# Patient Record
Sex: Female | Born: 1985 | Marital: Married | Smoking: Never smoker
Health system: Southern US, Community
[De-identification: ages and names within clinical notes are randomized; demographics above are authoritative.]

## PROBLEM LIST (undated history)

## (undated) DIAGNOSIS — Z789 Other specified health status: Secondary | ICD-10-CM

## (undated) HISTORY — PX: NO PAST SURGERIES: SHX2092

---

## 2010-12-17 ENCOUNTER — Other Ambulatory Visit: Payer: Self-pay | Admitting: Infectious Diseases

## 2010-12-17 ENCOUNTER — Ambulatory Visit
Admission: RE | Admit: 2010-12-17 | Discharge: 2010-12-17 | Disposition: A | Discharge status: Home / Self Care | Payer: PRIVATE HEALTH INSURANCE | Source: Ambulatory Visit | Attending: Infectious Diseases | Admitting: Infectious Diseases

## 2010-12-17 DIAGNOSIS — R9389 Abnormal findings on diagnostic imaging of other specified body structures: Secondary | ICD-10-CM

## 2010-12-17 DIAGNOSIS — R7611 Nonspecific reaction to tuberculin skin test without active tuberculosis: Secondary | ICD-10-CM

## 2012-12-13 IMAGING — CR DG CHEST 2V
2 series · 2 of 2 positions shown · non-contrast
Comparison: Chest x-ray from [facility] dated 07/19/2010

CLINICAL DATA: Positive PPD

CHEST - 2 VIEW

[view not recorded (1 of 2)]
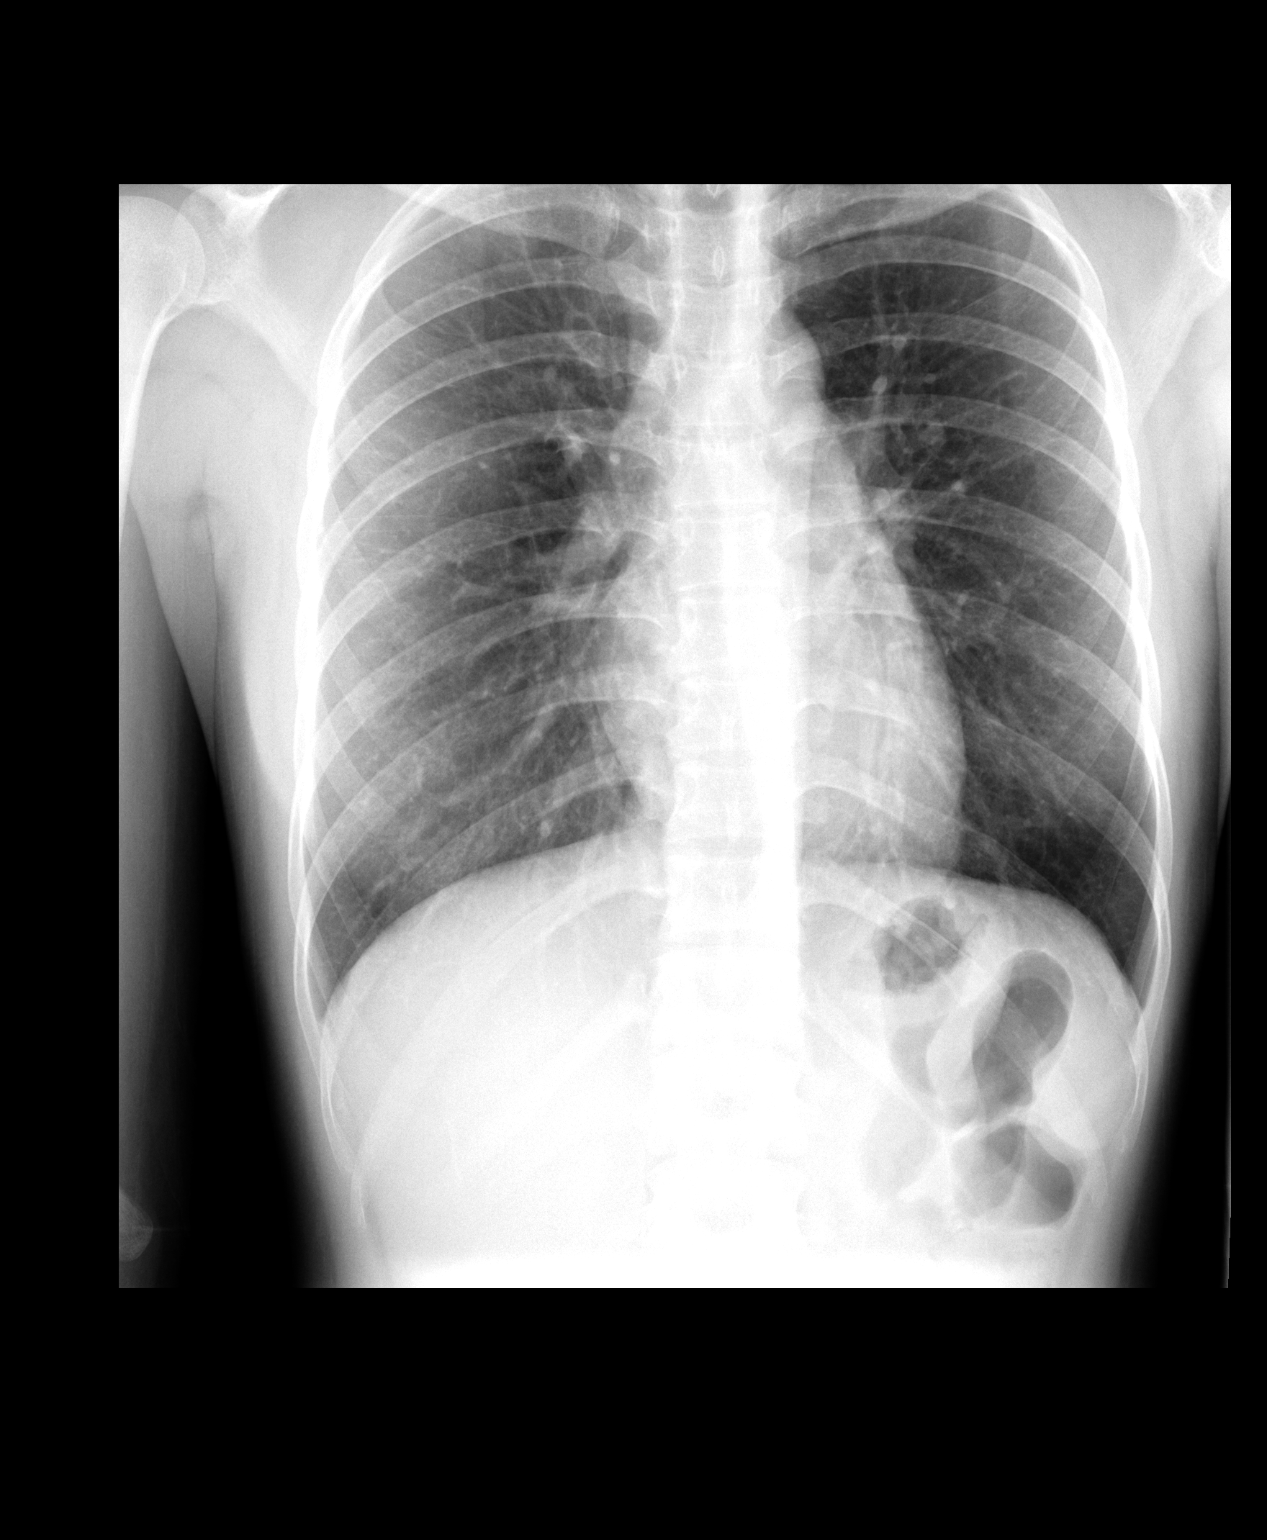

[view not recorded (2 of 2)]
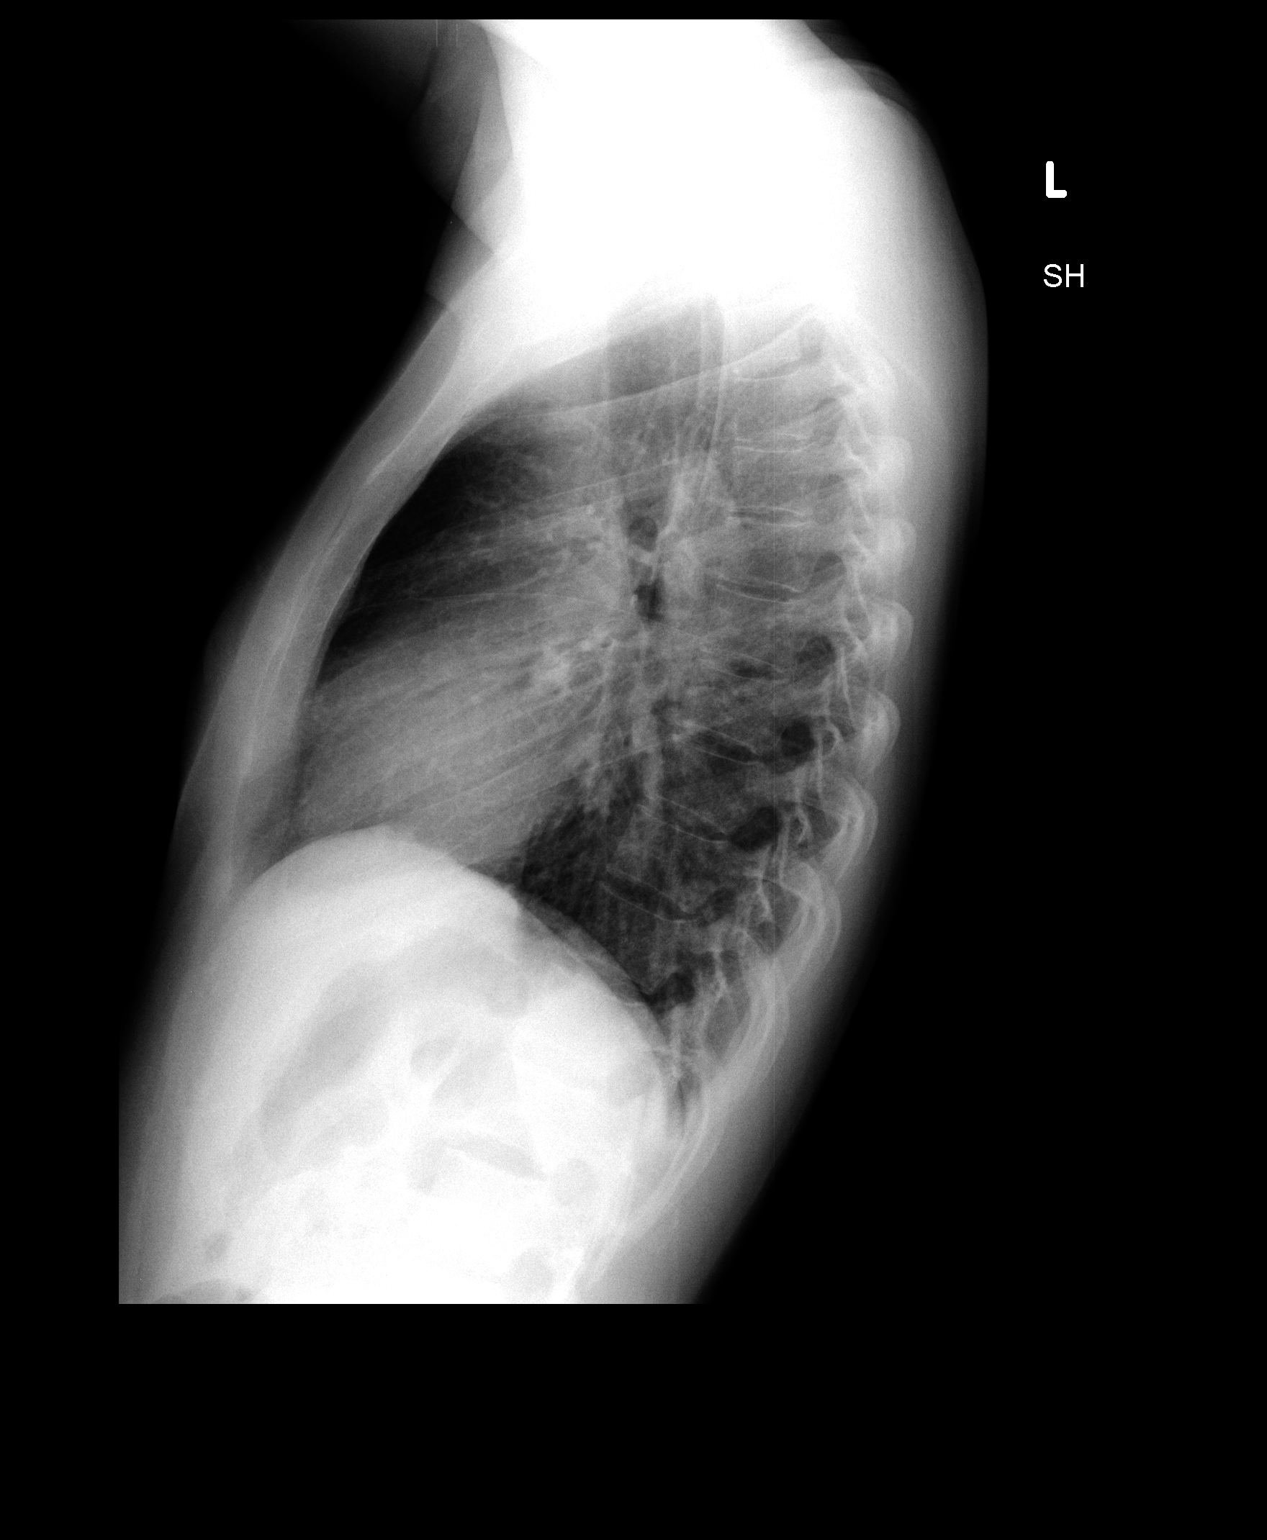

[2 of 2 positions shown; findings below may reference images not displayed]

FINDINGS: No active infiltrate or effusion is seen.  Mediastinal
contours appear normal.  The heart is within normal limits in size.
No bony abnormality is seen.
IMPRESSION: No active lung disease.

## 2018-10-16 DIAGNOSIS — Z3402 Encounter for supervision of normal first pregnancy, second trimester: Secondary | ICD-10-CM | POA: Diagnosis not present

## 2018-10-22 DIAGNOSIS — Z3009 Encounter for other general counseling and advice on contraception: Secondary | ICD-10-CM | POA: Diagnosis not present

## 2018-10-22 DIAGNOSIS — Z3402 Encounter for supervision of normal first pregnancy, second trimester: Secondary | ICD-10-CM | POA: Diagnosis not present

## 2018-10-22 DIAGNOSIS — Z1388 Encounter for screening for disorder due to exposure to contaminants: Secondary | ICD-10-CM | POA: Diagnosis not present

## 2018-10-22 DIAGNOSIS — Z3689 Encounter for other specified antenatal screening: Secondary | ICD-10-CM | POA: Diagnosis not present

## 2018-10-22 DIAGNOSIS — Z0389 Encounter for observation for other suspected diseases and conditions ruled out: Secondary | ICD-10-CM | POA: Diagnosis not present

## 2018-10-22 DIAGNOSIS — Z789 Other specified health status: Secondary | ICD-10-CM | POA: Diagnosis not present

## 2018-10-22 LAB — OB RESULTS CONSOLE RPR: RPR: NONREACTIVE

## 2018-10-22 LAB — OB RESULTS CONSOLE GC/CHLAMYDIA
Chlamydia: NEGATIVE
Gonorrhea: NEGATIVE

## 2018-10-22 LAB — OB RESULTS CONSOLE HEPATITIS B SURFACE ANTIGEN: Hepatitis B Surface Ag: NEGATIVE

## 2018-10-22 LAB — OB RESULTS CONSOLE HIV ANTIBODY (ROUTINE TESTING): HIV: NONREACTIVE

## 2018-10-22 LAB — OB RESULTS CONSOLE ANTIBODY SCREEN: Antibody Screen: NEGATIVE

## 2018-10-22 LAB — OB RESULTS CONSOLE ABO/RH: RH Type: POSITIVE

## 2018-10-22 LAB — OB RESULTS CONSOLE RUBELLA ANTIBODY, IGM: Rubella: IMMUNE

## 2018-11-02 DIAGNOSIS — Z362 Encounter for other antenatal screening follow-up: Secondary | ICD-10-CM | POA: Diagnosis not present

## 2018-11-18 DIAGNOSIS — Z3402 Encounter for supervision of normal first pregnancy, second trimester: Secondary | ICD-10-CM | POA: Diagnosis not present

## 2018-11-18 DIAGNOSIS — Z789 Other specified health status: Secondary | ICD-10-CM | POA: Diagnosis not present

## 2018-12-14 DIAGNOSIS — O365930 Maternal care for other known or suspected poor fetal growth, third trimester, not applicable or unspecified: Secondary | ICD-10-CM | POA: Diagnosis not present

## 2018-12-24 ENCOUNTER — Other Ambulatory Visit (HOSPITAL_COMMUNITY): Payer: Self-pay | Admitting: Obstetrics & Gynecology

## 2018-12-24 DIAGNOSIS — Z363 Encounter for antenatal screening for malformations: Secondary | ICD-10-CM

## 2018-12-28 DIAGNOSIS — Z23 Encounter for immunization: Secondary | ICD-10-CM | POA: Diagnosis not present

## 2018-12-28 DIAGNOSIS — Z3403 Encounter for supervision of normal first pregnancy, third trimester: Secondary | ICD-10-CM | POA: Diagnosis not present

## 2018-12-28 DIAGNOSIS — Z789 Other specified health status: Secondary | ICD-10-CM | POA: Diagnosis not present

## 2019-01-01 ENCOUNTER — Inpatient Hospital Stay (HOSPITAL_COMMUNITY): Admit: 2019-01-01 | Payer: PRIVATE HEALTH INSURANCE

## 2019-01-11 ENCOUNTER — Ambulatory Visit (HOSPITAL_COMMUNITY): Payer: PRIVATE HEALTH INSURANCE

## 2019-01-11 ENCOUNTER — Encounter (HOSPITAL_COMMUNITY): Payer: Self-pay

## 2019-01-11 DIAGNOSIS — Z3403 Encounter for supervision of normal first pregnancy, third trimester: Secondary | ICD-10-CM | POA: Diagnosis not present

## 2019-01-11 DIAGNOSIS — Z789 Other specified health status: Secondary | ICD-10-CM | POA: Diagnosis not present

## 2019-01-11 DIAGNOSIS — Z23 Encounter for immunization: Secondary | ICD-10-CM | POA: Diagnosis not present

## 2019-01-14 ENCOUNTER — Ambulatory Visit (HOSPITAL_COMMUNITY)
Admission: RE | Admit: 2019-01-14 | Discharge: 2019-01-14 | Disposition: A | Discharge status: Home / Self Care | Payer: Medicaid Other | Source: Ambulatory Visit | Attending: Obstetrics and Gynecology | Admitting: Obstetrics and Gynecology

## 2019-01-14 ENCOUNTER — Other Ambulatory Visit: Payer: Self-pay

## 2019-01-14 DIAGNOSIS — Z3A30 30 weeks gestation of pregnancy: Secondary | ICD-10-CM

## 2019-01-14 DIAGNOSIS — Z363 Encounter for antenatal screening for malformations: Secondary | ICD-10-CM | POA: Diagnosis not present

## 2019-01-14 DIAGNOSIS — O359XX0 Maternal care for (suspected) fetal abnormality and damage, unspecified, not applicable or unspecified: Secondary | ICD-10-CM | POA: Diagnosis not present

## 2019-01-14 HISTORY — DX: Other specified health status: Z78.9

## 2019-01-15 ENCOUNTER — Other Ambulatory Visit (HOSPITAL_COMMUNITY): Payer: Self-pay | Admitting: *Deleted

## 2019-01-15 DIAGNOSIS — Z362 Encounter for other antenatal screening follow-up: Secondary | ICD-10-CM

## 2019-01-21 ENCOUNTER — Encounter (HOSPITAL_COMMUNITY): Payer: Self-pay | Admitting: *Deleted

## 2019-02-10 DIAGNOSIS — Z789 Other specified health status: Secondary | ICD-10-CM | POA: Diagnosis not present

## 2019-02-10 DIAGNOSIS — Z23 Encounter for immunization: Secondary | ICD-10-CM | POA: Diagnosis not present

## 2019-02-10 DIAGNOSIS — Z3403 Encounter for supervision of normal first pregnancy, third trimester: Secondary | ICD-10-CM | POA: Diagnosis not present

## 2019-02-11 ENCOUNTER — Other Ambulatory Visit: Payer: Self-pay

## 2019-02-11 ENCOUNTER — Ambulatory Visit (HOSPITAL_COMMUNITY)
Admission: RE | Admit: 2019-02-11 | Discharge: 2019-02-11 | Disposition: A | Discharge status: Home / Self Care | Payer: Medicaid Other | Source: Ambulatory Visit | Attending: Obstetrics and Gynecology | Admitting: Obstetrics and Gynecology

## 2019-02-11 DIAGNOSIS — O359XX0 Maternal care for (suspected) fetal abnormality and damage, unspecified, not applicable or unspecified: Secondary | ICD-10-CM

## 2019-02-11 DIAGNOSIS — Z3A34 34 weeks gestation of pregnancy: Secondary | ICD-10-CM

## 2019-02-11 DIAGNOSIS — Z362 Encounter for other antenatal screening follow-up: Secondary | ICD-10-CM | POA: Diagnosis not present

## 2019-02-22 DIAGNOSIS — B373 Candidiasis of vulva and vagina: Secondary | ICD-10-CM | POA: Diagnosis not present

## 2019-02-22 DIAGNOSIS — Z789 Other specified health status: Secondary | ICD-10-CM | POA: Diagnosis not present

## 2019-02-22 DIAGNOSIS — Z23 Encounter for immunization: Secondary | ICD-10-CM | POA: Diagnosis not present

## 2019-02-22 DIAGNOSIS — Z3403 Encounter for supervision of normal first pregnancy, third trimester: Secondary | ICD-10-CM | POA: Diagnosis not present

## 2019-02-22 LAB — OB RESULTS CONSOLE GC/CHLAMYDIA
Chlamydia: NEGATIVE
Gonorrhea: NEGATIVE

## 2019-02-22 LAB — OB RESULTS CONSOLE GBS: GBS: NEGATIVE

## 2019-03-05 ENCOUNTER — Inpatient Hospital Stay (HOSPITAL_COMMUNITY)
Admission: AD | Admit: 2019-03-05 | Discharge: 2019-03-06 | Disposition: A | Discharge status: Home / Self Care | Payer: Medicaid Other | Attending: Obstetrics and Gynecology | Admitting: Obstetrics and Gynecology

## 2019-03-05 ENCOUNTER — Other Ambulatory Visit: Payer: Self-pay

## 2019-03-05 ENCOUNTER — Encounter (HOSPITAL_COMMUNITY): Payer: Self-pay | Admitting: *Deleted

## 2019-03-05 DIAGNOSIS — M7918 Myalgia, other site: Secondary | ICD-10-CM | POA: Diagnosis not present

## 2019-03-05 DIAGNOSIS — R531 Weakness: Secondary | ICD-10-CM | POA: Insufficient documentation

## 2019-03-05 DIAGNOSIS — Z3A37 37 weeks gestation of pregnancy: Secondary | ICD-10-CM

## 2019-03-05 DIAGNOSIS — Z3689 Encounter for other specified antenatal screening: Secondary | ICD-10-CM | POA: Diagnosis not present

## 2019-03-05 DIAGNOSIS — R05 Cough: Secondary | ICD-10-CM | POA: Insufficient documentation

## 2019-03-05 DIAGNOSIS — O26893 Other specified pregnancy related conditions, third trimester: Secondary | ICD-10-CM

## 2019-03-05 DIAGNOSIS — R109 Unspecified abdominal pain: Secondary | ICD-10-CM | POA: Insufficient documentation

## 2019-03-05 MED ORDER — CYCLOBENZAPRINE HCL 10 MG PO TABS: 10.0000 mg | ORAL_TABLET | Freq: Once | ORAL | Status: DC

## 2019-03-05 MED ORDER — ALUM & MAG HYDROXIDE-SIMETH 200-200-20 MG/5ML PO SUSP: 30.0000 mL | Freq: Once | ORAL | Status: DC

## 2019-03-05 MED ORDER — ACETAMINOPHEN 500 MG PO TABS
1000.0000 mg | ORAL_TABLET | Freq: Once | ORAL | Status: AC
  Administered 2019-03-05: 1000 mg via ORAL
  Filled 2019-03-05: qty 2

## 2019-03-05 NOTE — MAU Note (Signed)
Pt reports she has had upper abd pain and burning since yesterday. Reprots decreased fetal movement and a cough.

## 2019-03-05 NOTE — MAU Provider Note (Signed)
History     CSN: [id number]  Arrival date and time: 03/05/19 2055   None     Chief Complaint  Patient presents with  . Abdominal Pain   Rebekah Martinez is a 33 y.o. G1P0 at [redacted]w[redacted]d who receives care at [facility].  She presents today for Abdominal Pain, Coughing, and Weakness.  Patient reports that the symptoms started yesterday.  She reports the pain is located in her upper abdominal area and feels like intermittent burning.  She reports that pain is a 8/10 and lasts 10-15 seconds, stops, for about a half an hour.  Patient reports she experiences the pain worse after eating, but does not experience the pain at night while sleeping.  Patient also reports decreased fetal movement when compared to yesterday, but has felt fetal movement since arrival.  Patient goes on to state that the baby continues to move, it is just not as much. Patient requests Covid swab today stating that she has been having coughing since yesterday.  Patient denies fever, SOB, or recent contact with Covid positive individuals.   Intepretations completed by patient FOB     OB History    Gravida  1   Para      Term      Preterm      AB      Living  0     SAB      TAB      Ectopic      Multiple      Live Births              Past Medical History:  Diagnosis Date  . Medical history non-contributory     Past Surgical History:  Procedure Laterality Date  . NO PAST SURGERIES      No family history on file.  Social History   Tobacco Use  . Smoking status: Never Smoker  . Smokeless tobacco: Never Used  Substance Use Topics  . Alcohol use: Never    Frequency: Never  . Drug use: Never    Allergies: No Known Allergies  Medications Prior to Admission  Medication Sig Dispense Refill Last Dose  . prenatal vitamin w/FE, FA (PRENATAL 1 + 1) 27-1 MG TABS tablet Take 1 tablet by mouth daily at 12 noon.   03/05/2019 at Unknown time    Review of Systems  Constitutional: Negative for chills and  fever.  Respiratory: Positive for cough. Negative for shortness of breath.   Gastrointestinal: Positive for abdominal pain. Negative for constipation, diarrhea, nausea and vomiting.  Genitourinary: Negative for difficulty urinating, dysuria and pelvic pain.  Musculoskeletal: Negative for back pain.  Neurological: Negative for dizziness, light-headedness and headaches.   Physical Exam   Blood pressure 114/72, pulse 98, temperature 98.1 F (36.7 C), resp. rate 20, height 4\' 11"  (1.499 m), weight 62.6 kg, last menstrual period 07/03/2018.  Physical Exam  Constitutional: She is oriented to person, place, and time. She appears well-developed and well-nourished. No distress.  HENT:  Head: Normocephalic and atraumatic.  Eyes: Conjunctivae are normal.  Neck: Normal range of motion.  Cardiovascular: Normal rate, regular rhythm and normal heart sounds.  Respiratory: Effort normal and breath sounds normal.  GI: Soft. Bowel sounds are normal. There is abdominal tenderness in the right upper quadrant.  Tenderness is superficial to palpation.  No abnormalities palpated.   Musculoskeletal: Normal range of motion.  Neurological: She is alert and oriented to person, place, and time.  Skin: Skin is warm and dry.  Psychiatric:  She has a normal mood and affect. Her behavior is normal.    Fetal Assessment 145 bpm, Mod Var, -Decels, +Accels Toco: None graphed  MAU Course  No results found for this or any previous visit (from the past 24 hour(s)). No results found.  MDM PE Labs: None EFM Pain Medication Heat Application Assessment and Plan  33 year old G1P0  SIUP at 37.6weeks Cat I FT Abdominal Pain DFM Cough  -NST Reactive. -Exam findings discussed. -Patient informed that symptoms today not indicative of Covid testing, but that she can receive testing in the community if she desires.  -Reviewed the expectation of changes in fetal movement pattern with progression of pregnancy.    -Offered and accepts pain medication. -Will give tylenol 1 gram now. -Will apply heat to area and reassess.  Maryann Conners MSN, CNM 03/05/2019, 11:01 PM   Reassessment (1:00 AM)  -Nurse reports patient with relief of pain with tylenol dosing and application of heat. -Encouraged to call or return to MAU if symptoms worsen or with the onset of new symptoms. -Discharged to home in improved condition.   Maryann Conners MSN, CNM Advanced Practice Provider, Center for [employer]

## 2019-03-06 DIAGNOSIS — Z3689 Encounter for other specified antenatal screening: Secondary | ICD-10-CM | POA: Diagnosis not present

## 2019-03-06 DIAGNOSIS — R531 Weakness: Secondary | ICD-10-CM | POA: Diagnosis not present

## 2019-03-06 DIAGNOSIS — R05 Cough: Secondary | ICD-10-CM | POA: Diagnosis not present

## 2019-03-06 DIAGNOSIS — M7918 Myalgia, other site: Secondary | ICD-10-CM | POA: Diagnosis not present

## 2019-03-06 DIAGNOSIS — Z3A37 37 weeks gestation of pregnancy: Secondary | ICD-10-CM | POA: Diagnosis not present

## 2019-03-06 DIAGNOSIS — O26893 Other specified pregnancy related conditions, third trimester: Secondary | ICD-10-CM | POA: Diagnosis not present

## 2019-03-06 DIAGNOSIS — R109 Unspecified abdominal pain: Secondary | ICD-10-CM | POA: Diagnosis not present

## 2019-03-06 NOTE — Discharge Instructions (Signed)
Abdominal Pain During Pregnancy ° °Belly (abdominal) pain is common during pregnancy. There are many possible causes. Most of the time, it is not a serious problem. Other times, it can be a sign that something is wrong with the pregnancy. Always tell your doctor if you have belly pain. °Follow these instructions at home: °· Do not have sex or put anything in your vagina until your pain goes away completely. °· Get plenty of rest until your pain gets better. °· Drink enough fluid to keep your pee (urine) pale yellow. °· Take over-the-counter and prescription medicines only as told by your doctor. °· Keep all follow-up visits as told by your doctor. This is important. °Contact a doctor if: °· Your pain continues or gets worse after resting. °· You have lower belly pain that: °? Comes and goes at regular times. °? Spreads to your back. °? Feels like menstrual cramps. °· You have pain or burning when you pee (urinate). °Get help right away if: °· You have a fever or chills. °· You have vaginal bleeding. °· You are leaking fluid from your vagina. °· You are passing tissue from your vagina. °· You throw up (vomit) for more than 24 hours. °· You have watery poop (diarrhea) for more than 24 hours. °· Your baby is moving less than usual. °· You feel very weak or faint. °· You have shortness of breath. °· You have very bad pain in your upper belly. °Summary °· Belly (abdominal) pain is common during pregnancy. There are many possible causes. °· If you have belly pain during pregnancy, tell your doctor right away. °· Keep all follow-up visits as told by your doctor. This is important. °This information is not intended to replace advice given to you by your health care provider. Make sure you discuss any questions you have with your health care provider. °Document Released: 04/17/2009 Document Revised: 08/17/2018 Document Reviewed: 08/01/2016 °Elsevier Patient Education © 2020 Elsevier Inc. ° °

## 2019-03-16 DIAGNOSIS — Z23 Encounter for immunization: Secondary | ICD-10-CM | POA: Diagnosis not present

## 2019-03-16 DIAGNOSIS — Z20828 Contact with and (suspected) exposure to other viral communicable diseases: Secondary | ICD-10-CM | POA: Diagnosis not present

## 2019-03-16 DIAGNOSIS — Z3403 Encounter for supervision of normal first pregnancy, third trimester: Secondary | ICD-10-CM | POA: Diagnosis not present

## 2019-03-16 DIAGNOSIS — Z789 Other specified health status: Secondary | ICD-10-CM | POA: Diagnosis not present

## 2019-03-16 DIAGNOSIS — B373 Candidiasis of vulva and vagina: Secondary | ICD-10-CM | POA: Diagnosis not present

## 2019-03-22 ENCOUNTER — Telehealth (HOSPITAL_COMMUNITY): Payer: Self-pay | Admitting: *Deleted

## 2019-03-22 NOTE — Telephone Encounter (Signed)
[id number] interpreter number  Preadmission screen States had positive covid test at [facility]

## 2019-03-23 ENCOUNTER — Other Ambulatory Visit: Payer: Self-pay

## 2019-03-23 ENCOUNTER — Inpatient Hospital Stay (HOSPITAL_COMMUNITY)
Admission: AD | Admit: 2019-03-23 | Discharge: 2019-03-23 | Disposition: A | Discharge status: Home / Self Care | Payer: Medicaid Other | Attending: Obstetrics and Gynecology | Admitting: Obstetrics and Gynecology

## 2019-03-23 DIAGNOSIS — Z3403 Encounter for supervision of normal first pregnancy, third trimester: Secondary | ICD-10-CM | POA: Diagnosis not present

## 2019-03-23 DIAGNOSIS — Z789 Other specified health status: Secondary | ICD-10-CM | POA: Diagnosis not present

## 2019-03-23 DIAGNOSIS — Z3A40 40 weeks gestation of pregnancy: Secondary | ICD-10-CM | POA: Diagnosis not present

## 2019-03-23 DIAGNOSIS — O480 Post-term pregnancy: Secondary | ICD-10-CM | POA: Insufficient documentation

## 2019-03-23 DIAGNOSIS — Z20828 Contact with and (suspected) exposure to other viral communicable diseases: Secondary | ICD-10-CM | POA: Diagnosis not present

## 2019-03-23 DIAGNOSIS — B373 Candidiasis of vulva and vagina: Secondary | ICD-10-CM | POA: Diagnosis not present

## 2019-03-23 DIAGNOSIS — Z23 Encounter for immunization: Secondary | ICD-10-CM | POA: Diagnosis not present

## 2019-03-23 DIAGNOSIS — Z3689 Encounter for other specified antenatal screening: Secondary | ICD-10-CM | POA: Diagnosis not present

## 2019-03-23 NOTE — MAU Note (Signed)
Pt here for outpt, NST; post-dates unable to do at clinic

## 2019-03-23 NOTE — Progress Notes (Signed)
NST reviewed NST:  Baseline: 135 bpm, Variability: Good {> 6 bpm), Accelerations: Reactive and Decelerations: Absent   Jorje Guild, NP

## 2019-03-23 NOTE — Discharge Instructions (Signed)

## 2019-03-24 ENCOUNTER — Other Ambulatory Visit: Payer: Self-pay | Admitting: Advanced Practice Midwife

## 2019-03-25 ENCOUNTER — Other Ambulatory Visit (HOSPITAL_COMMUNITY)
Admission: RE | Admit: 2019-03-25 | Discharge: 2019-03-25 | Disposition: A | Discharge status: Home / Self Care | Payer: Medicaid Other | Source: Ambulatory Visit

## 2019-03-26 NOTE — Progress Notes (Signed)
Infection Prevention  Received call from: [facility]: L&D Regarding: pt coming in for induction & states she [facility] does she need isolation for induction on 03/27/19? IP Recommendation: per call to [facility], pt tested + Covid-19 on 03/09/19.  She has completed her 10 day isolation on 03/18/19.  Standard precautions recommended for upcoming admission 03/27/19.  Husband also tested + covid & is out of isolation window.

## 2019-03-27 ENCOUNTER — Other Ambulatory Visit: Payer: Self-pay

## 2019-03-27 ENCOUNTER — Inpatient Hospital Stay (HOSPITAL_COMMUNITY)
Admission: AD | Admit: 2019-03-27 | Discharge: 2019-03-30 | DRG: 788 | Disposition: A | Discharge status: Home / Self Care | Payer: Medicaid Other | Attending: Obstetrics & Gynecology | Admitting: Obstetrics & Gynecology

## 2019-03-27 ENCOUNTER — Encounter (HOSPITAL_COMMUNITY): Payer: Self-pay

## 2019-03-27 ENCOUNTER — Inpatient Hospital Stay (HOSPITAL_COMMUNITY): Payer: Medicaid Other

## 2019-03-27 DIAGNOSIS — O480 Post-term pregnancy: Secondary | ICD-10-CM | POA: Diagnosis not present

## 2019-03-27 DIAGNOSIS — Z98891 History of uterine scar from previous surgery: Secondary | ICD-10-CM

## 2019-03-27 DIAGNOSIS — U071 COVID-19: Secondary | ICD-10-CM | POA: Diagnosis not present

## 2019-03-27 DIAGNOSIS — Z8619 Personal history of other infectious and parasitic diseases: Secondary | ICD-10-CM

## 2019-03-27 DIAGNOSIS — Z789 Other specified health status: Secondary | ICD-10-CM

## 2019-03-27 DIAGNOSIS — Z3A41 41 weeks gestation of pregnancy: Secondary | ICD-10-CM

## 2019-03-27 DIAGNOSIS — O98513 Other viral diseases complicating pregnancy, third trimester: Secondary | ICD-10-CM | POA: Diagnosis not present

## 2019-03-27 LAB — CBC
HCT: 40.6 % (ref 36.0–46.0)
Hemoglobin: 13.8 g/dL (ref 12.0–15.0)
MCH: 31.3 pg (ref 26.0–34.0)
MCHC: 34 g/dL (ref 30.0–36.0)
MCV: 92.1 fL (ref 80.0–100.0)
Platelets: 313 10*3/uL (ref 150–400)
RBC: 4.41 MIL/uL (ref 3.87–5.11)
RDW: 14.1 % (ref 11.5–15.5)
WBC: 8.8 10*3/uL (ref 4.0–10.5)
nRBC: 0 % (ref 0.0–0.2)

## 2019-03-27 LAB — TYPE AND SCREEN
ABO/RH(D): O POS
Antibody Screen: NEGATIVE

## 2019-03-27 LAB — RPR: RPR Ser Ql: NONREACTIVE

## 2019-03-27 LAB — ABO/RH: ABO/RH(D): O POS

## 2019-03-27 MED ORDER — OXYCODONE-ACETAMINOPHEN 5-325 MG PO TABS: 1.0000 | ORAL_TABLET | ORAL | Status: DC | PRN

## 2019-03-27 MED ORDER — MISOPROSTOL 50MCG HALF TABLET
ORAL_TABLET | ORAL | Status: AC
  Filled 2019-03-27: qty 1

## 2019-03-27 MED ORDER — LACTATED RINGERS IV SOLN
500.0000 mL | INTRAVENOUS | Status: DC | PRN
  Administered 2019-03-28: 1000 mL via INTRAVENOUS

## 2019-03-27 MED ORDER — OXYCODONE-ACETAMINOPHEN 5-325 MG PO TABS: 2.0000 | ORAL_TABLET | ORAL | Status: DC | PRN

## 2019-03-27 MED ORDER — OXYTOCIN BOLUS FROM INFUSION: 500.0000 mL | Freq: Once | INTRAVENOUS | Status: DC

## 2019-03-27 MED ORDER — LACTATED RINGERS IV SOLN
500.0000 mL | Freq: Once | INTRAVENOUS | Status: AC
  Administered 2019-03-28: 500 mL via INTRAVENOUS

## 2019-03-27 MED ORDER — SOD CITRATE-CITRIC ACID 500-334 MG/5ML PO SOLN
30.0000 mL | ORAL | Status: DC | PRN
  Administered 2019-03-28: 30 mL via ORAL
  Filled 2019-03-27: qty 30

## 2019-03-27 MED ORDER — DIPHENHYDRAMINE HCL 50 MG/ML IJ SOLN: 12.5000 mg | INTRAMUSCULAR | Status: DC | PRN

## 2019-03-27 MED ORDER — PHENYLEPHRINE 40 MCG/ML (10ML) SYRINGE FOR IV PUSH (FOR BLOOD PRESSURE SUPPORT): 80.0000 ug | PREFILLED_SYRINGE | INTRAVENOUS | Status: DC | PRN

## 2019-03-27 MED ORDER — EPHEDRINE 5 MG/ML INJ: 10.0000 mg | INTRAVENOUS | Status: DC | PRN

## 2019-03-27 MED ORDER — MISOPROSTOL 50MCG HALF TABLET
50.0000 ug | ORAL_TABLET | ORAL | Status: DC | PRN
  Administered 2019-03-27: 50 ug via BUCCAL

## 2019-03-27 MED ORDER — MISOPROSTOL 25 MCG QUARTER TABLET
25.0000 ug | ORAL_TABLET | ORAL | Status: DC | PRN
  Administered 2019-03-27: 25 ug via VAGINAL
  Filled 2019-03-27: qty 1

## 2019-03-27 MED ORDER — FENTANYL CITRATE (PF) 100 MCG/2ML IJ SOLN
100.0000 ug | INTRAMUSCULAR | Status: DC | PRN
  Administered 2019-03-27 – 2019-03-28 (×3): 100 ug via INTRAVENOUS
  Filled 2019-03-27 (×3): qty 2

## 2019-03-27 MED ORDER — TERBUTALINE SULFATE 1 MG/ML IJ SOLN: 0.2500 mg | Freq: Once | INTRAMUSCULAR | Status: DC | PRN

## 2019-03-27 MED ORDER — ACETAMINOPHEN 325 MG PO TABS: 650.0000 mg | ORAL_TABLET | ORAL | Status: DC | PRN

## 2019-03-27 MED ORDER — ONDANSETRON HCL 4 MG/2ML IJ SOLN
4.0000 mg | Freq: Four times a day (QID) | INTRAMUSCULAR | Status: DC | PRN
  Administered 2019-03-28 (×2): 4 mg via INTRAVENOUS
  Filled 2019-03-27 (×2): qty 2

## 2019-03-27 MED ORDER — OXYTOCIN 40 UNITS IN NORMAL SALINE INFUSION - SIMPLE MED
1.0000 m[IU]/min | INTRAVENOUS | Status: DC
  Administered 2019-03-27 – 2019-03-28 (×2): 2 m[IU]/min via INTRAVENOUS

## 2019-03-27 MED ORDER — FENTANYL-BUPIVACAINE-NACL 0.5-0.125-0.9 MG/250ML-% EP SOLN
12.0000 mL/h | EPIDURAL | Status: DC | PRN
  Filled 2019-03-27: qty 250

## 2019-03-27 MED ORDER — LACTATED RINGERS IV SOLN
INTRAVENOUS | Status: DC
  Administered 2019-03-27 – 2019-03-28 (×7): via INTRAVENOUS

## 2019-03-27 MED ORDER — LIDOCAINE HCL (PF) 1 % IJ SOLN: 30.0000 mL | INTRAMUSCULAR | Status: DC | PRN

## 2019-03-27 MED ORDER — OXYTOCIN 40 UNITS IN NORMAL SALINE INFUSION - SIMPLE MED
2.5000 [IU]/h | INTRAVENOUS | Status: DC
  Filled 2019-03-27: qty 1000

## 2019-03-27 NOTE — H&P (Signed)
OBSTETRIC ADMISSION HISTORY AND PHYSICAL  Rebekah Martinez is a 33 y.o. female G1P0 with IUP at [redacted]w[redacted]d by LMP presenting for IOL for postdates. She reports +FMs, No LOF, no VB, no blurry vision, headaches or peripheral edema, and RUQ pain.  She plans on breast and bottle feeding. She is unsure what she desires for birth control. She received her prenatal care at [facility]   Patient reports she and her husband were symptomatic and positive for COVID on 10/27. Denies any symptoms at this time and states her last symptoms were more than a week ago.   Dating: By LMP --->  Estimated Date of Delivery: 03/20/19  Sono:    @[redacted]w[redacted]d , CWD, normal anatomy, cephalic presentation, 0277A, 69% EFW   Prenatal History/Complications:  Past Medical History: Past Medical History:  Diagnosis Date  . Medical history non-contributory     Past Surgical History: Past Surgical History:  Procedure Laterality Date  . NO PAST SURGERIES      Obstetrical History: OB History    Gravida  1   Para      Term      Preterm      AB      Living  0     SAB      TAB      Ectopic      Multiple      Live Births              Social History Social History   Socioeconomic History  . Marital status: Married    Spouse name: Not on file  . Number of children: Not on file  . Years of education: Not on file  . Highest education level: Not on file  Occupational History  . Not on file  Social Needs  . Financial resource strain: Not on file  . Food insecurity    Worry: Not on file    Inability: Not on file  . Transportation needs    Medical: Not on file    Non-medical: Not on file  Tobacco Use  . Smoking status: Never Smoker  . Smokeless tobacco: Never Used  Substance and Sexual Activity  . Alcohol use: Never    Frequency: Never  . Drug use: Never  . Sexual activity: Not on file  Lifestyle  . Physical activity    Days per week: Not on file    Minutes per session: Not on file  . Stress: Not on  file  Relationships  . Social Herbalist on phone: Not on file    Gets together: Not on file    Attends religious service: Not on file    Active member of club or organization: Not on file    Attends meetings of clubs or organizations: Not on file    Relationship status: Not on file  Other Topics Concern  . Not on file  Social History Narrative  . Not on file    Family History: No family history on file.  Allergies: No Known Allergies  Medications Prior to Admission  Medication Sig Dispense Refill Last Dose  . prenatal vitamin w/FE, FA (PRENATAL 1 + 1) 27-1 MG TABS tablet Take 1 tablet by mouth daily at 12 noon.        Review of Systems   All systems reviewed and negative except as stated in HPI  Last menstrual period 07/03/2018. General appearance: alert, cooperative and no distress Lungs: clear to auscultation bilaterally Heart: regular rate and rhythm Abdomen:  soft, non-tender; bowel sounds normal Pelvic: n/a Extremities: Homans sign is negative, no sign of DVT DTR's +2 Presentation: cephalic Fetal monitoringBaseline: 135 bpm, Variability: Good {> 6 bpm), Accelerations: Reactive and Decelerations: Absent Uterine activityFrequency: Every 2-7 minutes     Prenatal labs: ABO, Rh: O/Positive/-- (06/11 0000) Antibody: Negative (06/11 0000) Rubella: Immune (06/11 0000) RPR: Nonreactive (06/11 0000)  HBsAg: Negative (06/11 0000)  HIV: Non-reactive (06/11 0000)  GBS: Negative/-- (10/12 0000)   Prenatal Transfer Tool  Maternal Diabetes: No Genetic Screening: Declined Maternal Ultrasounds/Referrals: Normal, initially short femur, resolved 10/1 Fetal Ultrasounds or other Referrals:  Referred to Materal Fetal Medicine  Maternal Substance Abuse:  No Significant Maternal Medications:  None Significant Maternal Lab Results: Group B Strep negative  No results found for this or any previous visit (from the past 24 hour(s)).  Patient Active Problem List    Diagnosis Date Noted  . Post term pregnancy at [redacted] weeks gestation 03/27/2019    Assessment/Plan:  Rebekah Martinez is a 33 y.o. G1P0 at [redacted]w[redacted]d here for IOL for postdates  #Labor: cytotec and FB when able #Pain: Per patient request #FWB:  Cat 1 #ID:  GBS neg, COVID+ 10/27, asymptomatic #MOF: Both #MOC: unsure #Circ:  outpatient  Rebekah Martinez, CNM  03/27/2019, 8:22 AM

## 2019-03-27 NOTE — Progress Notes (Signed)
LABOR PROGRESS NOTE  Rebekah Martinez is a 33 y.o. G1P0 at 108w0d  admitted for IOL for postdates.   Subjective: Strip note. Discussed plan of care with RN.   Objective: BP 118/76   Pulse 89   Temp 98.8 F (37.1 C) (Oral)   Resp 18   Ht 4\' 11"  (1.499 m)   Wt 62.6 kg   LMP 07/03/2018   SpO2 99%   BMI 27.87 kg/m  or  Vitals:   03/27/19 1803 03/27/19 1954 03/27/19 2100 03/27/19 2130  BP: 115/70 112/73 113/73 118/76  Pulse: 76 77 83 89  Resp: 16 18 18    Temp:  98.8 F (37.1 C)    TempSrc:  Oral    SpO2:      Weight:      Height:        Dilation: 5 Effacement (%): 50 Station: -1 Presentation: Vertex Exam by:: Meghan Rice, RN FHT: baseline rate 130, moderate varibility, +acel, no decel Toco: q1-4 min   Labs: Lab Results  Component Value Date   WBC 8.8 03/27/2019   HGB 13.8 03/27/2019   HCT 40.6 03/27/2019   MCV 92.1 03/27/2019   PLT 313 03/27/2019    Patient Active Problem List   Diagnosis Date Noted  . Post term pregnancy at [redacted] weeks gestation 03/27/2019    Assessment / Plan: 33 y.o. G1P0 at [redacted]w[redacted]d here for IOL for postdates. Pregnancy complicated by recent COVID+ test.   Labor: Induction. Patient s/p FB and cyto. Appeared to have been contracting adequately but no cervical change on subsequent exam so started Pitocin.  Fetal Wellbeing:  Cat I  Pain Control:  Plans for epidural  Anticipated MOD:  NSVD   Phill Myron, D.O. OB Fellow  03/27/2019, 9:40 PM

## 2019-03-27 NOTE — Progress Notes (Signed)
Labor Progress Note Rebekah Martinez is a 33 y.o. G1P0 at [redacted]w[redacted]d presented for IOL for postdates  S:  Patient comfortable, feels some but not all contractions.   O:  BP 112/70   Pulse 74   Temp 98.2 F (36.8 C) (Oral)   Resp 16   Ht 4\' 11"  (1.499 m)   Wt 62.6 kg   LMP 07/03/2018   SpO2 99%   BMI 27.87 kg/m   Fetal Tracing:  Baseline: 140 Variability: moderate Accels: 15x15 Decels: none  Toco: 2-4   CVE: Dilation: 1 Effacement (%): 60 Station: -2 Presentation: Vertex Exam by:: Sharolyn Douglas, CNM   A&P: 33 y.o. G1P0 [redacted]w[redacted]d IOL for postdates #Labor: Cervix unchanged. Unable to place foley due to patient discomfort. Will give another cytotec and attempt foley with next exam.  #Pain: per patient request #FWB: Cat 1 #GBS negative   Wende Mott, CNM 3:10 PM

## 2019-03-27 NOTE — Progress Notes (Signed)
Labor Progress Note Rebekah Martinez is a 33 y.o. G1P0 at [redacted]w[redacted]d presented for IOL for postdates  S:  Patient requesting pain medication  O:  BP 115/70   Pulse 76   Temp 98.3 F (36.8 C) (Oral)   Resp 16   Ht 4\' 11"  (1.499 m)   Wt 62.6 kg   LMP 07/03/2018   SpO2 99%   BMI 27.87 kg/m   Fetal Tracing:  Baseline: 130 Variability: moderate Accels: 15x15 Decels: none  Toco: 1-2  CVE: Dilation: 1 Effacement (%): 60 Station: -2 Presentation: Vertex Exam by:: C. Ladoris Lythgoe,CNM   A&P: 33 y.o. G1P0 [redacted]w[redacted]d IOL for postdates #Labor: Cervix unchanged. Discussed with patient placement of foley balloon for cervical ripening. Risks and benefits reviewed. Patient agreeable to plan of care. Foley balloon inserted without difficulty and inflated with 60cc sterile water. Patient tolerated procedure well. Patient contracting too much for more cytotec at this time. Will observe and give another dose if contractions space out.  #Pain: IV fentanyl #FWB: Cat 1 #GBS negative   Wende Mott, CNM 6:30 PM

## 2019-03-28 ENCOUNTER — Encounter (HOSPITAL_COMMUNITY)
Admission: AD | Disposition: A | Discharge status: Home / Self Care | Payer: Self-pay | Source: Home / Self Care | Attending: Obstetrics & Gynecology

## 2019-03-28 ENCOUNTER — Inpatient Hospital Stay (HOSPITAL_COMMUNITY): Payer: Medicaid Other | Admitting: Anesthesiology

## 2019-03-28 DIAGNOSIS — U071 COVID-19: Secondary | ICD-10-CM

## 2019-03-28 DIAGNOSIS — Z789 Other specified health status: Secondary | ICD-10-CM

## 2019-03-28 SURGERY — Surgical Case
Anesthesia: Epidural

## 2019-03-28 MED ORDER — FENTANYL CITRATE (PF) 100 MCG/2ML IJ SOLN: 25.0000 ug | INTRAMUSCULAR | Status: DC | PRN

## 2019-03-28 MED ORDER — ONDANSETRON HCL 4 MG/2ML IJ SOLN: 4.0000 mg | Freq: Once | INTRAMUSCULAR | Status: DC | PRN

## 2019-03-28 MED ORDER — DIPHENHYDRAMINE HCL 50 MG/ML IJ SOLN: 12.5000 mg | INTRAMUSCULAR | Status: DC | PRN

## 2019-03-28 MED ORDER — LIDOCAINE HCL (PF) 1 % IJ SOLN
INTRAMUSCULAR | Status: DC | PRN
  Administered 2019-03-28: 11 mL via EPIDURAL

## 2019-03-28 MED ORDER — METOCLOPRAMIDE HCL 5 MG/ML IJ SOLN
INTRAMUSCULAR | Status: DC | PRN
  Administered 2019-03-28: 10 mg via INTRAVENOUS

## 2019-03-28 MED ORDER — OXYTOCIN 40 UNITS IN NORMAL SALINE INFUSION - SIMPLE MED
INTRAVENOUS | Status: DC | PRN
  Administered 2019-03-28: 40 [IU] via INTRAVENOUS

## 2019-03-28 MED ORDER — NALBUPHINE HCL 10 MG/ML IJ SOLN
5.0000 mg | INTRAMUSCULAR | Status: DC | PRN
  Filled 2019-03-28: qty 0.5

## 2019-03-28 MED ORDER — KETOROLAC TROMETHAMINE 30 MG/ML IJ SOLN
INTRAMUSCULAR | Status: AC
  Filled 2019-03-28: qty 1

## 2019-03-28 MED ORDER — OXYTOCIN 40 UNITS IN NORMAL SALINE INFUSION - SIMPLE MED
INTRAVENOUS | Status: AC
  Filled 2019-03-28: qty 1000

## 2019-03-28 MED ORDER — PHENYLEPHRINE 40 MCG/ML (10ML) SYRINGE FOR IV PUSH (FOR BLOOD PRESSURE SUPPORT)
PREFILLED_SYRINGE | INTRAVENOUS | Status: AC
  Filled 2019-03-28: qty 10

## 2019-03-28 MED ORDER — MEPERIDINE HCL 25 MG/ML IJ SOLN: 6.2500 mg | INTRAMUSCULAR | Status: DC | PRN

## 2019-03-28 MED ORDER — SCOPOLAMINE 1 MG/3DAYS TD PT72
MEDICATED_PATCH | TRANSDERMAL | Status: AC
  Filled 2019-03-28: qty 1

## 2019-03-28 MED ORDER — SODIUM CHLORIDE 0.9% FLUSH: 3.0000 mL | INTRAVENOUS | Status: DC | PRN

## 2019-03-28 MED ORDER — CEFAZOLIN SODIUM-DEXTROSE 2-4 GM/100ML-% IV SOLN
INTRAVENOUS | Status: AC
  Filled 2019-03-28: qty 100

## 2019-03-28 MED ORDER — MORPHINE SULFATE (PF) 0.5 MG/ML IJ SOLN
INTRAMUSCULAR | Status: AC
  Filled 2019-03-28: qty 10

## 2019-03-28 MED ORDER — NALBUPHINE HCL 10 MG/ML IJ SOLN
5.0000 mg | Freq: Once | INTRAMUSCULAR | Status: DC | PRN
  Filled 2019-03-28: qty 0.5

## 2019-03-28 MED ORDER — ACETAMINOPHEN 325 MG PO TABS: 325.0000 mg | ORAL_TABLET | ORAL | Status: DC | PRN

## 2019-03-28 MED ORDER — ACETAMINOPHEN 160 MG/5ML PO SOLN: 325.0000 mg | ORAL | Status: DC | PRN

## 2019-03-28 MED ORDER — LIDOCAINE-EPINEPHRINE (PF) 2 %-1:200000 IJ SOLN
INTRAMUSCULAR | Status: AC
  Filled 2019-03-28: qty 10

## 2019-03-28 MED ORDER — KETOROLAC TROMETHAMINE 30 MG/ML IJ SOLN: 30.0000 mg | Freq: Four times a day (QID) | INTRAMUSCULAR | Status: AC | PRN

## 2019-03-28 MED ORDER — SCOPOLAMINE 1 MG/3DAYS TD PT72
1.0000 | MEDICATED_PATCH | Freq: Once | TRANSDERMAL | Status: DC
  Administered 2019-03-28: 1.5 mg via TRANSDERMAL

## 2019-03-28 MED ORDER — LACTATED RINGERS IV SOLN
INTRAVENOUS | Status: DC | PRN
  Administered 2019-03-28 (×2): via INTRAVENOUS

## 2019-03-28 MED ORDER — SODIUM CHLORIDE (PF) 0.9 % IJ SOLN
INTRAMUSCULAR | Status: DC | PRN
  Administered 2019-03-28: 12 mL/h via EPIDURAL

## 2019-03-28 MED ORDER — SODIUM CHLORIDE 0.9 % IV SOLN
500.0000 mg | INTRAVENOUS | Status: AC
  Administered 2019-03-28: 500 mg via INTRAVENOUS
  Filled 2019-03-28: qty 500

## 2019-03-28 MED ORDER — ONDANSETRON HCL 4 MG/2ML IJ SOLN
INTRAMUSCULAR | Status: DC | PRN
  Administered 2019-03-28: 4 mg via INTRAVENOUS

## 2019-03-28 MED ORDER — BUPIVACAINE HCL (PF) 0.5 % IJ SOLN
INTRAMUSCULAR | Status: DC | PRN
  Administered 2019-03-28: 30 mL

## 2019-03-28 MED ORDER — PHENYLEPHRINE HCL (PRESSORS) 10 MG/ML IV SOLN
INTRAVENOUS | Status: DC | PRN
  Administered 2019-03-28 (×3): 40 ug via INTRAVENOUS

## 2019-03-28 MED ORDER — NALOXONE HCL 0.4 MG/ML IJ SOLN: 0.4000 mg | INTRAMUSCULAR | Status: DC | PRN

## 2019-03-28 MED ORDER — MORPHINE SULFATE (PF) 0.5 MG/ML IJ SOLN
INTRAMUSCULAR | Status: DC | PRN
  Administered 2019-03-28: 3 mg via EPIDURAL

## 2019-03-28 MED ORDER — METOCLOPRAMIDE HCL 5 MG/ML IJ SOLN
INTRAMUSCULAR | Status: AC
  Filled 2019-03-28: qty 2

## 2019-03-28 MED ORDER — CEFAZOLIN SODIUM-DEXTROSE 2-4 GM/100ML-% IV SOLN
2.0000 g | INTRAVENOUS | Status: AC
  Administered 2019-03-28: 2 g via INTRAVENOUS

## 2019-03-28 MED ORDER — SODIUM BICARBONATE 8.4 % IV SOLN
INTRAVENOUS | Status: DC | PRN
  Administered 2019-03-28 (×2): 5 mL via EPIDURAL

## 2019-03-28 MED ORDER — DEXAMETHASONE SODIUM PHOSPHATE 10 MG/ML IJ SOLN
INTRAMUSCULAR | Status: AC
  Filled 2019-03-28: qty 1

## 2019-03-28 MED ORDER — ONDANSETRON HCL 4 MG/2ML IJ SOLN
INTRAMUSCULAR | Status: AC
  Filled 2019-03-28: qty 2

## 2019-03-28 MED ORDER — DEXAMETHASONE SODIUM PHOSPHATE 10 MG/ML IJ SOLN
INTRAMUSCULAR | Status: DC | PRN
  Administered 2019-03-28: 10 mg via INTRAVENOUS

## 2019-03-28 MED ORDER — OXYCODONE HCL 5 MG PO TABS: 5.0000 mg | ORAL_TABLET | Freq: Once | ORAL | Status: DC | PRN

## 2019-03-28 MED ORDER — DIPHENHYDRAMINE HCL 25 MG PO CAPS: 25.0000 mg | ORAL_CAPSULE | ORAL | Status: DC | PRN

## 2019-03-28 MED ORDER — ONDANSETRON HCL 4 MG/2ML IJ SOLN: 4.0000 mg | Freq: Three times a day (TID) | INTRAMUSCULAR | Status: DC | PRN

## 2019-03-28 MED ORDER — SODIUM CHLORIDE 0.9 % IV SOLN
INTRAVENOUS | Status: DC | PRN
  Administered 2019-03-28: 21:00:00 via INTRAVENOUS

## 2019-03-28 MED ORDER — NALOXONE HCL 4 MG/10ML IJ SOLN
1.0000 ug/kg/h | INTRAVENOUS | Status: DC | PRN
  Filled 2019-03-28: qty 5

## 2019-03-28 MED ORDER — SODIUM BICARBONATE 8.4 % IV SOLN
INTRAVENOUS | Status: AC
  Filled 2019-03-28: qty 50

## 2019-03-28 MED ORDER — BUPIVACAINE HCL (PF) 0.5 % IJ SOLN
INTRAMUSCULAR | Status: AC
  Filled 2019-03-28: qty 30

## 2019-03-28 MED ORDER — OXYCODONE HCL 5 MG/5ML PO SOLN: 5.0000 mg | Freq: Once | ORAL | Status: DC | PRN

## 2019-03-28 SURGICAL SUPPLY — 35 items
BARRIER ADHS 3X4 INTERCEED (GAUZE/BANDAGES/DRESSINGS) IMPLANT
CHLORAPREP W/TINT 26ML (MISCELLANEOUS) ×3 IMPLANT
CLAMP CORD UMBIL (MISCELLANEOUS) IMPLANT
CLOSURE WOUND 1/2 X4 (GAUZE/BANDAGES/DRESSINGS) ×1
CLOTH BEACON ORANGE TIMEOUT ST (SAFETY) ×3 IMPLANT
DRSG OPSITE POSTOP 4X10 (GAUZE/BANDAGES/DRESSINGS) ×3 IMPLANT
ELECT REM PT RETURN 9FT ADLT (ELECTROSURGICAL) ×3
ELECTRODE REM PT RTRN 9FT ADLT (ELECTROSURGICAL) ×1 IMPLANT
EXTRACTOR VACUUM KIWI (MISCELLANEOUS) IMPLANT
GAUZE SPONGE 4X4 12PLY STRL (GAUZE/BANDAGES/DRESSINGS) ×6 IMPLANT
GLOVE BIO SURGEON STRL SZ 6.5 (GLOVE) ×2 IMPLANT
GLOVE BIO SURGEONS STRL SZ 6.5 (GLOVE) ×1
GLOVE BIOGEL PI IND STRL 7.0 (GLOVE) ×2 IMPLANT
GLOVE BIOGEL PI INDICATOR 7.0 (GLOVE) ×4
GOWN STRL REUS W/TWL LRG LVL3 (GOWN DISPOSABLE) ×6 IMPLANT
KIT ABG SYR 3ML LUER SLIP (SYRINGE) IMPLANT
NEEDLE HYPO 22GX1.5 SAFETY (NEEDLE) IMPLANT
NEEDLE HYPO 25X5/8 SAFETYGLIDE (NEEDLE) IMPLANT
NS IRRIG 1000ML POUR BTL (IV SOLUTION) ×3 IMPLANT
PACK C SECTION WH (CUSTOM PROCEDURE TRAY) ×3 IMPLANT
PAD ABD 8X10 STRL (GAUZE/BANDAGES/DRESSINGS) ×3 IMPLANT
PAD OB MATERNITY 4.3X12.25 (PERSONAL CARE ITEMS) ×3 IMPLANT
PENCIL SMOKE EVAC W/HOLSTER (ELECTROSURGICAL) ×3 IMPLANT
RETRACTOR WND ALEXIS 25 LRG (MISCELLANEOUS) IMPLANT
RTRCTR WOUND ALEXIS 25CM LRG (MISCELLANEOUS)
STRIP CLOSURE SKIN 1/2X4 (GAUZE/BANDAGES/DRESSINGS) ×2 IMPLANT
SUT VIC AB 0 CT1 36 (SUTURE) ×18 IMPLANT
SUT VIC AB 2-0 CT1 27 (SUTURE) ×2
SUT VIC AB 2-0 CT1 TAPERPNT 27 (SUTURE) ×1 IMPLANT
SUT VIC AB 4-0 PS2 27 (SUTURE) ×3 IMPLANT
SYR CONTROL 10ML LL (SYRINGE) IMPLANT
TAPE CLOTH SURG 4X10 WHT LF (GAUZE/BANDAGES/DRESSINGS) ×3 IMPLANT
TOWEL OR 17X24 6PK STRL BLUE (TOWEL DISPOSABLE) ×3 IMPLANT
TRAY FOLEY W/BAG SLVR 14FR LF (SET/KITS/TRAYS/PACK) IMPLANT
WATER STERILE IRR 1000ML POUR (IV SOLUTION) ×3 IMPLANT

## 2019-03-28 NOTE — Progress Notes (Addendum)
LABOR PROGRESS NOTE  Haroldine Redler is a 33 y.o. G1P0 at [redacted]w[redacted]d  admitted for IOL for postdates.   Subjective: Patient comfortable with epidural. FOB at bedside and has signed to be interpreter.   Objective: BP 114/66   Pulse 98   Temp 98 F (36.7 C) (Oral)   Resp 16   Ht 4\' 11"  (1.499 m)   Wt 62.6 kg   LMP 07/03/2018   SpO2 99%   BMI 27.87 kg/m  or  Vitals:   03/28/19 0330 03/28/19 0400 03/28/19 0430 03/28/19 0500  BP: 112/68 114/76 (!) 100/53 114/66  Pulse: 68 82 85 98  Resp:      Temp:      TempSrc:      SpO2:      Weight:      Height:        Dilation: 7 Effacement (%): 70 Station: -1, 0 Presentation: Vertex Exam by:: Meghan Rice, RN FHT: baseline rate 130, moderate varibility, +acel, no decel Toco: q1-4 min   Labs: Lab Results  Component Value Date   WBC 8.8 03/27/2019   HGB 13.8 03/27/2019   HCT 40.6 03/27/2019   MCV 92.1 03/27/2019   PLT 313 03/27/2019    Patient Active Problem List   Diagnosis Date Noted  . Post term pregnancy at [redacted] weeks gestation 03/27/2019    Assessment / Plan: 33 y.o. G1P0 at [redacted]w[redacted]d here for IOL for postdates. Pregnancy complicated by recent COVID+ test.   Labor: SROM @0450  with moderate meconium stained fluid. Patient on Pitocin at 16 mu/min. Has not had cervical change between subsequent exams. IUPC now in place to evaluate adequacy of contractions and adjust Pitocin as appropriate. Try position changes to encourage further cervical dilation. Epidural placed between cervical exams.  Fetal Wellbeing:  Cat I  Pain Control:  Epidural  Anticipated MOD:  NSVD   Phill Myron, D.O. OB Fellow  03/28/2019, 5:29 AM

## 2019-03-28 NOTE — Progress Notes (Signed)
Noticed pt has a red area at the rt ankle bone.  Pt wants to stay in this positon.  Pillow placed under feet

## 2019-03-28 NOTE — Progress Notes (Signed)
Urine blood tinged

## 2019-03-28 NOTE — Progress Notes (Signed)
Started to push with patient.  Pt became very emotional and upset.  Mila Palmer CNM called and requested her to talk with pt

## 2019-03-28 NOTE — Progress Notes (Signed)
Dr Roselie Awkward, patient and pt husband have been made aware

## 2019-03-28 NOTE — Progress Notes (Signed)
Pitocin turned off per verbal order of [facility]

## 2019-03-28 NOTE — Op Note (Signed)
Rebekah Martinez PROCEDURE DATE: 03/28/2019  PREOPERATIVE DIAGNOSES: Intrauterine pregnancy at [redacted]w[redacted]d weeks gestation; failure to progress: arrest of descent  POSTOPERATIVE DIAGNOSES: The same  PROCEDURE: Primary Low Transverse Cesarean Section  SURGEON:  Dr. Scheryl Darter - Primary Jerilynn Birkenhead, MD - Fellow   ANESTHESIOLOGY TEAM: Anesthesiologist: Lowella Curb, MD; Bethena Midget, MD CRNA: Orlie Pollen, CRNA  INDICATIONS: Rebekah Martinez is a 33 y.o. G1P0 at [redacted]w[redacted]d here for cesarean section secondary to the indications listed under preoperative diagnoses; please see preoperative note for further details.  The risks of cesarean section were discussed with the patient including but were not limited to: bleeding which may require transfusion or reoperation; infection which may require antibiotics; injury to bowel, bladder, ureters or other surrounding organs; injury to the fetus; need for additional procedures including hysterectomy in the event of a life-threatening hemorrhage; placental abnormalities wth subsequent pregnancies, incisional problems, thromboembolic phenomenon and other postoperative/anesthesia complications.   The patient concurred with the proposed plan, giving informed written consent for the procedure.    FINDINGS:  Viable female infant in cephalic OP presentation. Thick meconium-stained fluid. Intact placenta, three vessel cord.  Normal uterus, fallopian tubes and ovaries bilaterally. APGAR (1 MIN): 7   APGAR (5 MINS): 9   APGAR (10 MINS):    ANESTHESIA: Epidural  INTRAVENOUS FLUIDS: 1000 ml   ESTIMATED BLOOD LOSS: 473 ml URINE OUTPUT:  500 ml SPECIMENS: Placenta sent to L&D COMPLICATIONS: None immediate  PROCEDURE IN DETAIL:  The patient preoperatively received intravenous antibiotics and had sequential compression devices applied to her lower extremities.  She was then taken to the operating room where the epidural anesthesia was dosed up to surgical level and was found  to be adequate. She was then placed in a dorsal supine position with a leftward tilt, and prepped and draped in a sterile manner.  A foley catheter had been previously placed into her bladder and attached to constant gravity.  After an adequate timeout was performed, a Pfannenstiel skin incision was made with scalpel and carried through to the underlying layer of fascia. The fascia was incised in the midline, and this incision was extended bilaterally using the Mayo scissors.  Kocher clamps were applied to the superior aspect of the fascial incision and the underlying rectus muscles were dissected off bluntly and sharply.  A similar process was carried out on the inferior aspect of the fascial incision. The rectus muscles were separated in the midline and the peritoneum was entered bluntly. The Alexis self-retaining retractor was introduced into the abdominal cavity.  Attention was turned to the lower uterine segment where a low transverse hysterotomy was made with a scalpel and extended bilaterally bluntly. Thick meconium-stained fluid present. Infant in occiput posterior position. The infant was successfully delivered, the cord was clamped and cut after one minute, and the infant was handed over to the awaiting neonatology team. Uterine massage was then administered, and the placenta delivered intact with a three-vessel cord. The uterus was then cleared of clots and debris.  The hysterotomy was closed with 0 Vicryl in a running locked fashion, and an imbricating layer was also placed with 0 Vicryl.  Figure-of-eight 0 Vicryl serosal stitches were placed to help with hemostasis.  The pelvis was cleared of all clot and debris. Hemostasis was confirmed on all surfaces.  The retractor was removed.  The peritoneum was closed with a 0 Vicryl running stitch and the rectus muscles were reapproximated using 0 Vicryl interrupted stitches. The fascia was then closed  using 0 Vicryl in a running fashion. 30 mL 0.5% Marcaine  injected around incision site. The skin was closed with a 4-0 Vicryl subcuticular stitch. The patient tolerated the procedure well. Sponge, instrument and needle counts were correct x 3.  She was taken to the recovery room in stable condition.   Barrington Ellison, MD [facility] Family Medicine Fellow, [facility] for [employer], [place]

## 2019-03-28 NOTE — Progress Notes (Signed)
CNM at bedside to reassess cervix after another 1.5 hours of pushing. No change in fetal position since she was complete at 1400. Dr. Roselie Awkward called to bedside to assess and recommends c/s. Patient refused at this time stating that she wants more time to let baby move down in pelvis. Dr. Roselie Awkward to recheck patient in 1 hour for progress.  Wende Mott, CNM 03/28/19 7:12 PM

## 2019-03-28 NOTE — Progress Notes (Signed)
Red area still persist.  Pt told to either change position or extend leg over pillow

## 2019-03-28 NOTE — Anesthesia Preprocedure Evaluation (Signed)

## 2019-03-28 NOTE — Progress Notes (Addendum)
Per Dr. Roselie Awkward; Arrest of Descent with no progress from 0 station for > 6 hours. Dr. Roselie Awkward assessed at bedside twice. Patient now agreeable to C-section. The risks of cesarean section discussed with the patient included but were not limited to: bleeding which may require transfusion or reoperation; infection which may require antibiotics; injury to bowel, bladder, ureters or other surrounding organs; injury to the fetus; need for additional procedures including hysterectomy in the event of a life-threatening hemorrhage; placental abnormalities wth subsequent pregnancies, incisional problems, thromboembolic phenomenon and other postoperative/anesthesia complications. The patient concurred with the proposed plan, giving informed written consent for the procedure.  Patient has been NPO for > 12 hours she will remain NPO for procedure. Anesthesia and OR aware. Preoperative prophylactic antibiotics and SCDs ordered on call to the OR.  To OR when ready.  Barrington Ellison, MD [facility] Family Medicine Fellow, [facility] for [employer], [place]

## 2019-03-28 NOTE — Progress Notes (Signed)
IUPC accidentally removed by patient while moving in bed.  Toco applied

## 2019-03-28 NOTE — Transfer of Care (Signed)
Immediate Anesthesia Transfer of Care Note  Patient: Rebekah Martinez  Procedure(s) Performed: CESAREAN SECTION (N/A )  Patient Location: PACU  Anesthesia Type:Epidural  Level of Consciousness: awake, alert , oriented and patient cooperative  Airway & Oxygen Therapy: Patient Spontanous Breathing  Post-op Assessment: Report given to RN and Post -op Vital signs reviewed and stable  Post vital signs: Reviewed and stable  Last Vitals:  Vitals Value Taken Time  BP    Temp    Pulse 95 03/28/19 2219  Resp 16 03/28/19 2219  SpO2 89 % 03/28/19 2219  Vitals shown include unvalidated device data.  Last Pain:  Vitals:   03/28/19 2030  TempSrc:   PainSc: 0-No pain         Complications: No apparent anesthesia complications

## 2019-03-28 NOTE — Progress Notes (Signed)
Labor Progress Note Rebekah Martinez is a 33 y.o. G1P0 at [redacted]w[redacted]d presented for IOL for postdates  S:  Patient comfortable, pitocin restarted at 1130  O:  BP (!) 113/57   Pulse 84   Temp 98.7 F (37.1 C) (Oral)   Resp 20   Ht 4\' 11"  (1.499 m)   Wt 62.6 kg   LMP 07/03/2018   SpO2 99%   BMI 27.87 kg/m   Fetal Tracing:  Baseline: 150 Variability: moderate Accels: 15x15 Decels: none  Toco: 2-4   CVE: Dilation: 10 Dilation Complete Date: 03/28/19 Dilation Complete Time: 1400 Effacement (%): 100 Station: 0 Presentation: Vertex Exam by:: s grindstaff rn    A&P: 33 y.o. G1P0 [redacted]w[redacted]d IOL for postdates #Labor: Progressing well. Start pushing. Now with thick particulate meconium. Plan for NICU team at delivery #Pain: epidural #FWB: Cat 1 #GBS negative  Wende Mott, CNM 2:21 PM

## 2019-03-28 NOTE — Anesthesia Procedure Notes (Signed)
Epidural Patient location during procedure: OB Start time: 03/28/2019 12:36 AM End time: 03/28/2019 12:53 AM  Staffing Anesthesiologist: Lynda Rainwater, MD Performed: anesthesiologist   Preanesthetic Checklist Completed: patient identified, site marked, surgical consent, pre-op evaluation, timeout performed, IV checked, risks and benefits discussed and monitors and equipment checked  Epidural Patient position: sitting Prep: ChloraPrep Patient monitoring: heart rate, cardiac monitor, continuous pulse ox and blood pressure Approach: midline Location: L2-L3 Injection technique: LOR saline  Needle:  Needle type: Tuohy  Needle gauge: 17 G Needle length: 9 cm Needle insertion depth: 4 cm Catheter type: closed end flexible Catheter size: 20 Guage Catheter at skin depth: 8 cm Test dose: negative  Assessment Events: blood not aspirated, injection not painful, no injection resistance, negative IV test and no paresthesia  Additional Notes Reason for block:procedure for pain

## 2019-03-28 NOTE — Progress Notes (Signed)
Asked pt which position she would like to push.  She indicated that she would prefer in her present positon

## 2019-03-28 NOTE — Progress Notes (Signed)
Labor Progress Note Rebekah Martinez is a 33 y.o. G1P0 at [redacted]w[redacted]d presented for postdates IOL  S:  Patient comfortable with epidural  O:  BP (!) 91/46   Pulse 86   Temp 100.2 F (37.9 C) (Oral)   Resp 20   Ht 4\' 11"  (1.499 m)   Wt 62.6 kg   LMP 07/03/2018   SpO2 99%   BMI 27.87 kg/m   Fetal Tracing:  Baseline:  155 Variability: moderate Accels: 15x15 Decels: none  Toco: 2-4   CVE: Dilation: 8 Effacement (%): 90 Station: 0 Presentation: Vertex Exam by:: McNeill   A&P: 33 y.o. G1P0 [redacted]w[redacted]d IOL for postdates #Labor: Minimal cervical change since last exam. MVUs consistently inadequate since placement of IUPC and pitocin at 28 mu/min. Will take 2 hour pit break and restart. Will continue to monitor maternal temperature #Pain: epidural #FWB: Cat 1 #GBS negative  Wende Mott, CNM 10:06 AM

## 2019-03-28 NOTE — Discharge Summary (Signed)
Postpartum Discharge Summary      Patient Name: Rebekah Martinez DOB: [birth year] MRN: [id number]  Date of admission: 03/27/2019 Delivering Provider: Chauncey Mann   Date of discharge: 03/30/2019  Admitting diagnosis: PREG Intrauterine pregnancy: [redacted]w[redacted]d    Secondary diagnosis:  Active Problems:   Post term pregnancy at 469weeks gestation   Arrest of descent, delivered, current hospitalization   Lab test positive for detection of COVID-19 virus   Language barrier  Additional problems: None     Discharge diagnosis: Term Pregnancy Delivered                                                                                                Post partum procedures:None  Augmentation: Pitocin, Cytotec and Foley Balloon  Complications: None  Hospital course:  Induction of Labor With Vaginal Delivery   33y.o. yo G1P0 at 471w1das admitted to the hospital 03/27/2019 for induction of labor.  Indication for induction: Postdates.  Patient received Cytotec, Foley bulb and Pitocin. SROM occurred. Thick meconium-stained fluid present. Patient progressed to compete dilation and pushed for several hours with no descent past 0 station for > 6 hours.  Membrane Rupture Time/Date: 4:50 AM ,03/28/2019   Intrapartum Procedures: Episiotomy: None [1]                                         Lacerations:  None [1]  Patient had delivery of a Viable infant.  Information for the patient's newborn:  GuRozlynn, Lippold0[[id number]]Delivery Method: CS-LTranv    03/28/2019  Details of delivery can be found in separate delivery note. COVID positive on 10/27 but asymptomatic throughout stay. Unsure about birth control. Patient had a routine postpartum course. Patient is discharged home 03/30/19. Delivery time: 9:22 PM    Magnesium Sulfate received: No BMZ received: No Rhophylac:No MMR:No Transfusion:No  Physical exam  Vitals:   03/29/19 1212 03/29/19 1600 03/29/19 2200 03/30/19 0600  BP:  115/76 93/63 (!)  91/54  Pulse:  61 71 63  Resp: '17 17 16 16  ' Temp: 98 F (36.7 C) 98 F (36.7 C) 98.2 F (36.8 C) 98.2 F (36.8 C)  TempSrc:  Oral Oral Oral  SpO2: 99%  100% 99%  Weight:      Height:       General: alert, cooperative and no distress Lochia: appropriate Uterine Fundus: firm Incision: Healing well with no significant drainage DVT Evaluation: No evidence of DVT seen on physical exam. Labs: Lab Results  Component Value Date   WBC 23.2 (H) 03/29/2019   HGB 11.1 (L) 03/29/2019   HCT 33.8 (L) 03/29/2019   MCV 93.6 03/29/2019   PLT 224 03/29/2019   CMP Latest Ref Rng & Units 03/29/2019  Creatinine 0.44 - 1.00 mg/dL 1.09(H)    Discharge instruction: per After Visit Summary and "Baby and Me Booklet".  After visit meds:  Allergies as of 03/30/2019   No Known Allergies     Medication List  TAKE these medications   ibuprofen 800 MG tablet Commonly known as: ADVIL Take 1 tablet (800 mg total) by mouth every 8 (eight) hours as needed.   oxyCODONE-acetaminophen 5-325 MG tablet Commonly known as: Percocet Take 1 tablet by mouth every 4 (four) hours as needed for severe pain.   prenatal vitamin w/FE, FA 27-1 MG Tabs tablet Take 1 tablet by mouth daily at 12 noon.       Diet: routine diet  Activity: Advance as tolerated. Pelvic rest for 6 weeks.   Outpatient follow up:4 weeks Follow up Appt: Future Appointments  Date Time Provider [place]  04/12/2019  1:30 PM [place] [facility]   Follow up Visit: [place] for [facility] Follow up.   Specialty: Obstetrics and Gynecology Why: one week for incision check  Contact information: [address] [place], [place] [id number] mc [place] [phone]       Department, [facility] Follow up.   Why: 4 weeks for postpartum visit  Contact information: [place]  [address] [phone]            Please schedule this patient for Postpartum visit in: 4 weeks with the following provider: Any provider For C/S patients schedule nurse incision check in weeks 2 weeks: yes Low risk pregnancy complicated by: none Delivery mode:  CS Anticipated Birth Control:  other/unsure PP Procedures needed: Incision check  Schedule Integrated BH visit: no      Newborn Data: Live born female  Birth Weight: 3905 g APGAR: 7, 9  Newborn Delivery   Birth date/time: 03/28/2019 21:22:00 Delivery type: C-Section, Low Transverse Trial of labor: No C-section categorization: Primary      Baby Feeding: Bottle and Breast Disposition:home with mother  Int# [id number] used for patient encounter  Marcille Buffy DNP, CNM  03/30/19  9:48 AM

## 2019-03-28 NOTE — Progress Notes (Signed)
Pt refusing c/section at this time.  Pt told that changing position would increase her chance of the baby descending

## 2019-03-29 ENCOUNTER — Encounter (HOSPITAL_COMMUNITY): Payer: Self-pay

## 2019-03-29 LAB — CBC
HCT: 33.8 % — ABNORMAL LOW (ref 36.0–46.0)
Hemoglobin: 11.1 g/dL — ABNORMAL LOW (ref 12.0–15.0)
MCH: 30.7 pg (ref 26.0–34.0)
MCHC: 32.8 g/dL (ref 30.0–36.0)
MCV: 93.6 fL (ref 80.0–100.0)
Platelets: 224 10*3/uL (ref 150–400)
RBC: 3.61 MIL/uL — ABNORMAL LOW (ref 3.87–5.11)
RDW: 14.3 % (ref 11.5–15.5)
WBC: 23.2 10*3/uL — ABNORMAL HIGH (ref 4.0–10.5)
nRBC: 0 % (ref 0.0–0.2)

## 2019-03-29 LAB — CREATININE, SERUM
Creatinine, Ser: 1.09 mg/dL — ABNORMAL HIGH (ref 0.44–1.00)
GFR calc Af Amer: >60 mL/min (ref >60)
GFR calc non Af Amer: >60 mL/min (ref >60)

## 2019-03-29 MED ORDER — MENTHOL 3 MG MT LOZG: 1.0000 | LOZENGE | OROMUCOSAL | Status: DC | PRN

## 2019-03-29 MED ORDER — SENNOSIDES-DOCUSATE SODIUM 8.6-50 MG PO TABS
2.0000 | ORAL_TABLET | ORAL | Status: DC
  Administered 2019-03-29: 2 via ORAL
  Filled 2019-03-29: qty 2

## 2019-03-29 MED ORDER — WITCH HAZEL-GLYCERIN EX PADS: 1.0000 "application " | MEDICATED_PAD | CUTANEOUS | Status: DC | PRN

## 2019-03-29 MED ORDER — SIMETHICONE 80 MG PO CHEW
80.0000 mg | CHEWABLE_TABLET | Freq: Three times a day (TID) | ORAL | Status: DC
  Administered 2019-03-29 – 2019-03-30 (×4): 80 mg via ORAL
  Filled 2019-03-29 (×4): qty 1

## 2019-03-29 MED ORDER — OXYTOCIN 40 UNITS IN NORMAL SALINE INFUSION - SIMPLE MED: 2.5000 [IU]/h | INTRAVENOUS | Status: AC

## 2019-03-29 MED ORDER — SIMETHICONE 80 MG PO CHEW
80.0000 mg | CHEWABLE_TABLET | ORAL | Status: DC
  Administered 2019-03-29: 80 mg via ORAL
  Filled 2019-03-29: qty 1

## 2019-03-29 MED ORDER — OXYCODONE HCL 5 MG PO TABS: 5.0000 mg | ORAL_TABLET | ORAL | Status: DC | PRN

## 2019-03-29 MED ORDER — IBUPROFEN 800 MG PO TABS
800.0000 mg | ORAL_TABLET | Freq: Three times a day (TID) | ORAL | Status: DC
  Administered 2019-03-29 (×2): 800 mg via ORAL
  Filled 2019-03-29 (×4): qty 1

## 2019-03-29 MED ORDER — COCONUT OIL OIL: 1.0000 "application " | TOPICAL_OIL | Status: DC | PRN

## 2019-03-29 MED ORDER — ACETAMINOPHEN 325 MG PO TABS
650.0000 mg | ORAL_TABLET | Freq: Four times a day (QID) | ORAL | Status: DC | PRN
  Administered 2019-03-29 – 2019-03-30 (×3): 650 mg via ORAL
  Filled 2019-03-29 (×3): qty 2

## 2019-03-29 MED ORDER — TETANUS-DIPHTH-ACELL PERTUSSIS 5-2.5-18.5 LF-MCG/0.5 IM SUSP: 0.5000 mL | Freq: Once | INTRAMUSCULAR | Status: DC

## 2019-03-29 MED ORDER — ZOLPIDEM TARTRATE 5 MG PO TABS: 5.0000 mg | ORAL_TABLET | Freq: Every evening | ORAL | Status: DC | PRN

## 2019-03-29 MED ORDER — DIBUCAINE (PERIANAL) 1 % EX OINT: 1.0000 "application " | TOPICAL_OINTMENT | CUTANEOUS | Status: DC | PRN

## 2019-03-29 MED ORDER — SIMETHICONE 80 MG PO CHEW: 80.0000 mg | CHEWABLE_TABLET | ORAL | Status: DC | PRN

## 2019-03-29 MED ORDER — DIPHENHYDRAMINE HCL 25 MG PO CAPS: 25.0000 mg | ORAL_CAPSULE | Freq: Four times a day (QID) | ORAL | Status: DC | PRN

## 2019-03-29 MED ORDER — PRENATAL MULTIVITAMIN CH
1.0000 | ORAL_TABLET | Freq: Every day | ORAL | Status: DC
  Administered 2019-03-29: 1 via ORAL
  Filled 2019-03-29: qty 1

## 2019-03-29 MED ORDER — LACTATED RINGERS IV SOLN
INTRAVENOUS | Status: DC
  Administered 2019-03-29: 08:00:00 via INTRAVENOUS

## 2019-03-29 MED ORDER — ENOXAPARIN SODIUM 40 MG/0.4ML ~~LOC~~ SOLN: 40.0000 mg | SUBCUTANEOUS | Status: DC

## 2019-03-29 NOTE — Progress Notes (Signed)
Rebekah Martinez [id number] Postpartum Day 1 S/P Primary Cesarean Section due to Failure to Descend  Subjective: Patient up ad lib, denies syncope or dizziness. Reports consuming regular diet without issues and denies N/V. Patient reports no bowel movement or no passing flatus.  Denies issues with urination and reports bleeding is "yea, okay."  Patient is bottle feeding and reports going well.  Unsure of postpartum contraception.  Pain is being appropriately managed.   Objective: Temp:  [97.6 F (36.4 C)-100.2 F (37.9 C)] 98.9 F (37.2 C) (11/16 0452) Pulse Rate:  [73-101] 83 (11/16 0452) Resp:  [16-28] 20 (11/16 0452) BP: (84-124)/(33-65) 98/60 (11/16 0452) SpO2:  [78 %-100 %] 99 % (11/16 0452)  Recent Labs    03/27/19 0832 03/29/19 0434  HGB 13.8 11.1*  HCT 40.6 33.8*  WBC 8.8 23.2*    Physical Exam:  General: alert, cooperative and no distress Mood/Affect: Appropriate/Congruent Lungs: clear to auscultation, no wheezes, rales or rhonchi, symmetric air entry.  Heart: normal rate and regular rhythm. Breast: not examined. Abdomen: +  bowel sounds, Appropriately Tender Incision: Pressure Dressing in Place Uterine Fundus: firm, U/-1 Lochia: appropriate Skin: Warm, Dry. DVT Evaluation: No evidence of DVT seen on physical exam. No significant calf/ankle edema. SCDs on  JP drain:  None  Assessment Post Operative Day 1 S/P Primary C/S Normal Involution BreastFeeding Hemodynamically Stable  Plan: -Discussed ambulation once foley bulb removed. -Informed of progression of diet throughout day. -Encouraged to take pain medication as needed. -Contraception information added to discharge instructions.  -Continue other mgmt as ordered  Rebekah Conners MSN, CNM 03/29/2019, 6:49 AM

## 2019-03-29 NOTE — Anesthesia Postprocedure Evaluation (Signed)
Anesthesia Post Note  Patient: Rebekah Martinez  Procedure(s) Performed: CESAREAN SECTION (N/A )     Patient location during evaluation: Mother Baby Anesthesia Type: Epidural Level of consciousness: awake and alert Pain management: pain level controlled Vital Signs Assessment: post-procedure vital signs reviewed and stable Respiratory status: spontaneous breathing, nonlabored ventilation and respiratory function stable Cardiovascular status: stable Postop Assessment: no headache, no backache and epidural receding Anesthetic complications: no    Last Vitals:  Vitals:   03/29/19 1212 03/29/19 1600  BP:  115/76  Pulse:  61  Resp: 17 17  Temp: 36.7 C 36.7 C  SpO2: 99%     Last Pain:  Vitals:   03/29/19 1720  TempSrc:   PainSc: 0-No pain                 Raymond Azure

## 2019-03-29 NOTE — Discharge Instructions (Signed)
Contraception Choices Contraception, also called birth control, refers to methods or devices that prevent pregnancy. Hormonal methods Contraceptive implant  A contraceptive implant is a thin, plastic tube that contains a hormone. It is inserted into the upper part of the arm. It can remain in place for up to 3 years. Progestin-only injections Progestin-only injections are injections of progestin, a synthetic form of the hormone progesterone. They are given every 3 months by a health care provider. Birth control pills  Birth control pills are pills that contain hormones that prevent pregnancy. They must be taken once a day, preferably at the same time each day. Birth control patch  The birth control patch contains hormones that prevent pregnancy. It is placed on the skin and must be changed once a week for three weeks and removed on the fourth week. A prescription is needed to use this method of contraception. Vaginal ring  A vaginal ring contains hormones that prevent pregnancy. It is placed in the vagina for three weeks and removed on the fourth week. After that, the process is repeated with a new ring. A prescription is needed to use this method of contraception. Emergency contraceptive Emergency contraceptives prevent pregnancy after unprotected sex. They come in pill form and can be taken up to 5 days after sex. They work best the sooner they are taken after having sex. Most emergency contraceptives are available without a prescription. This method should not be used as your only form of birth control. Barrier methods Female condom  A female condom is a thin sheath that is worn over the penis during sex. Condoms keep sperm from going inside a woman's body. They can be used with a spermicide to increase their effectiveness. They should be disposed after a single use. Female condom  A female condom is a soft, loose-fitting sheath that is put into the vagina before sex. The condom keeps sperm  from going inside a woman's body. They should be disposed after a single use. Diaphragm  A diaphragm is a soft, dome-shaped barrier. It is inserted into the vagina before sex, along with a spermicide. The diaphragm blocks sperm from entering the uterus, and the spermicide kills sperm. A diaphragm should be left in the vagina for 6-8 hours after sex and removed within 24 hours. A diaphragm is prescribed and fitted by a health care provider. A diaphragm should be replaced every 1-2 years, after giving birth, after gaining more than 15 lb (6.8 kg), and after pelvic surgery. Cervical cap  A cervical cap is a round, soft latex or plastic cup that fits over the cervix. It is inserted into the vagina before sex, along with spermicide. It blocks sperm from entering the uterus. The cap should be left in place for 6-8 hours after sex and removed within 48 hours. A cervical cap must be prescribed and fitted by a health care provider. It should be replaced every 2 years. Sponge  A sponge is a soft, circular piece of polyurethane foam with spermicide on it. The sponge helps block sperm from entering the uterus, and the spermicide kills sperm. To use it, you make it wet and then insert it into the vagina. It should be inserted before sex, left in for at least 6 hours after sex, and removed and thrown away within 30 hours. Spermicides Spermicides are chemicals that kill or block sperm from entering the cervix and uterus. They can come as a cream, jelly, suppository, foam, or tablet. A spermicide should be inserted into the   vagina with an applicator at least 10-15 minutes before sex to allow time for it to work. The process must be repeated every time you have sex. Spermicides do not require a prescription. Intrauterine contraception Intrauterine device (IUD) An IUD is a T-shaped device that is put in a woman's uterus. There are two types:  Hormone IUD.This type contains progestin, a synthetic form of the hormone  progesterone. This type can stay in place for 3-5 years.  Copper IUD.This type is wrapped in copper wire. It can stay in place for 10 years.  Permanent methods of contraception Female tubal ligation In this method, a woman's fallopian tubes are sealed, tied, or blocked during surgery to prevent eggs from traveling to the uterus. Hysteroscopic sterilization In this method, a small, flexible insert is placed into each fallopian tube. The inserts cause scar tissue to form in the fallopian tubes and block them, so sperm cannot reach an egg. The procedure takes about 3 months to be effective. Another form of birth control must be used during those 3 months. Female sterilization This is a procedure to tie off the tubes that carry sperm (vasectomy). After the procedure, the man can still ejaculate fluid (semen). Natural planning methods Natural family planning In this method, a couple does not have sex on days when the woman could become pregnant. Calendar method This means keeping track of the length of each menstrual cycle, identifying the days when pregnancy can happen, and not having sex on those days. Ovulation method In this method, a couple avoids sex during ovulation. Symptothermal method This method involves not having sex during ovulation. The woman typically checks for ovulation by watching changes in her temperature and in the consistency of cervical mucus. Post-ovulation method In this method, a couple waits to have sex until after ovulation. Summary  Contraception, also called birth control, means methods or devices that prevent pregnancy.  Hormonal methods of contraception include implants, injections, pills, patches, vaginal rings, and emergency contraceptives.  Barrier methods of contraception can include female condoms, female condoms, diaphragms, cervical caps, sponges, and spermicides.  There are two types of IUDs (intrauterine devices). An IUD can be put in a woman's uterus to  prevent pregnancy for 3-5 years.  Permanent sterilization can be done through a procedure for males, females, or both.  Natural family planning methods involve not having sex on days when the woman could become pregnant. This information is not intended to replace advice given to you by your health care provider. Make sure you discuss any questions you have with your health care provider. Document Released: 04/29/2005 Document Revised: 05/01/2017 Document Reviewed: 06/01/2016 Elsevier Patient Education  2020 [employer]vier Inc.  

## 2019-03-30 DIAGNOSIS — O98513 Other viral diseases complicating pregnancy, third trimester: Secondary | ICD-10-CM

## 2019-03-30 DIAGNOSIS — U071 COVID-19: Secondary | ICD-10-CM

## 2019-03-30 MED ORDER — OXYCODONE-ACETAMINOPHEN 5-325 MG PO TABS: 1.0000 | ORAL_TABLET | ORAL | 0 refills | Status: DC | PRN

## 2019-03-30 MED ORDER — IBUPROFEN 800 MG PO TABS: 800.0000 mg | ORAL_TABLET | Freq: Three times a day (TID) | ORAL | 0 refills | Status: DC | PRN

## 2019-04-12 ENCOUNTER — Ambulatory Visit: Payer: Medicaid Other

## 2019-04-16 ENCOUNTER — Ambulatory Visit (INDEPENDENT_AMBULATORY_CARE_PROVIDER_SITE_OTHER): Payer: Medicaid Other

## 2019-04-16 ENCOUNTER — Other Ambulatory Visit: Payer: Self-pay

## 2019-04-16 VITALS — BP 113/68 | HR 76 | Wt 114.5 lb

## 2019-04-16 DIAGNOSIS — Z9889 Other specified postprocedural states: Secondary | ICD-10-CM

## 2019-04-16 DIAGNOSIS — Z5189 Encounter for other specified aftercare: Secondary | ICD-10-CM

## 2019-04-16 NOTE — Progress Notes (Signed)
Pt here today for incision check s/p c-section on 03/28/19.  Video Interpreter # [id number] Pt reports mild pain that she does not take pain medication for.  I recommended to take ibuprofen as needed.  She also reports scant vaginal bleeding.  I informed pt that is normal post delivery.  Incision healing well.  Incision well approximated, no odor, no drainage, and no erythema.  Pt advised to f/u with [facility] to schedule her pp visit.  Pt given info to [facility] to schedule.  Pt verbalized understanding.    Mel Almond, RN 04/16/19

## 2019-04-19 NOTE — Progress Notes (Signed)
Patient seen and assessed by nursing staff during this encounter. I have reviewed the chart and agree with the documentation and plan.  Min Collymore, MD 04/19/2019 9:20 AM    

## 2019-05-18 DIAGNOSIS — Z3009 Encounter for other general counseling and advice on contraception: Secondary | ICD-10-CM | POA: Diagnosis not present

## 2019-07-23 ENCOUNTER — Encounter: Payer: Self-pay | Admitting: Nurse Practitioner

## 2019-07-23 ENCOUNTER — Ambulatory Visit (INDEPENDENT_AMBULATORY_CARE_PROVIDER_SITE_OTHER): Payer: Medicaid Other | Admitting: Nurse Practitioner

## 2019-07-23 ENCOUNTER — Other Ambulatory Visit: Payer: Self-pay

## 2019-07-23 VITALS — BP 112/70 | HR 68 | Ht 59.0 in | Wt 119.0 lb

## 2019-07-23 DIAGNOSIS — Z975 Presence of (intrauterine) contraceptive device: Secondary | ICD-10-CM | POA: Insufficient documentation

## 2019-07-23 DIAGNOSIS — Z30017 Encounter for initial prescription of implantable subdermal contraceptive: Secondary | ICD-10-CM | POA: Diagnosis not present

## 2019-07-23 LAB — POCT PREGNANCY, URINE: Preg Test, Ur: NEGATIVE

## 2019-07-23 MED ORDER — ETONOGESTREL 68 MG ~~LOC~~ IMPL
68.0000 mg | DRUG_IMPLANT | Freq: Once | SUBCUTANEOUS | Status: AC
  Administered 2019-07-23: 68 mg via SUBCUTANEOUS

## 2019-07-23 NOTE — Progress Notes (Signed)
     GYNECOLOGY OFFICE PROCEDURE NOTE  Rebekah Martinez is a 34 y.o. G1P1001 here for  Nexplanon insertion.  Interpreter present via phone and all client's questions were answered. Last pap smear was on 6-11-210 and was normal.  No other gynecologic concerns.  Client had her baby 4 months ago.  [facility]  She was not able to get a postpartum appointment for [facility].  Planning to use the Nexplanon for 3 years until she is ready to have another baby.  Nexplanon Insertion Patient identified, informed consent performed, consent signed.   Appropriate time out taken. Nexplanon site identified [employer] company guidelines.  Area prepped in usual sterile fashon. Three ml of 1% lidocaine was used to anesthetize the area at the distal end of the implant.   There was minimal blood loss. There were no complications. Patient and provider palpated rod in her arm.  A pressure bandage was applied to reduce any bruising.  The patient tolerated the procedure well and was given post procedure instructions.  Patient is planning to use abstinence for contraception for 2 weeks.  Advised to call the office if she is having problems with intermittent vaginal bleeding.  LOT [id number]  EXP [id number]  Nolene Bernheim, RN, MSN, NP-BC Nurse Practitioner, [facility] for [employer], [facility] 07/23/2019 10:27 AM

## 2019-09-06 ENCOUNTER — Encounter: Payer: Self-pay | Admitting: *Deleted

## 2020-04-28 ENCOUNTER — Encounter (HOSPITAL_COMMUNITY): Payer: Self-pay | Admitting: Emergency Medicine

## 2020-04-28 ENCOUNTER — Ambulatory Visit (HOSPITAL_COMMUNITY)
Admission: EM | Admit: 2020-04-28 | Discharge: 2020-04-28 | Disposition: A | Discharge status: Home / Self Care | Payer: Medicaid Other | Attending: Emergency Medicine | Admitting: Emergency Medicine

## 2020-04-28 ENCOUNTER — Other Ambulatory Visit: Payer: Self-pay

## 2020-04-28 DIAGNOSIS — R21 Rash and other nonspecific skin eruption: Secondary | ICD-10-CM

## 2020-04-28 MED ORDER — PREDNISONE 10 MG (21) PO TBPK: ORAL_TABLET | Freq: Every day | ORAL | 0 refills | Status: DC

## 2020-04-28 NOTE — ED Provider Notes (Signed)
[facility]    CSN: [id number] Arrival date & time: 04/28/20  1524      History   Chief Complaint Chief Complaint  Patient presents with  . Skin Problem    HPI Rebekah Martinez is a 34 y.o. female.   Patient presents with a rash on her trunk and extremities x3 days.  The rash started on her abdomen and has spread; nonpainful, nonpruritic.  She denies new products, foods, medications.  No treatments attempted at home.  She denies fever, chills, sore throat, cough, shortness of breath, vomiting, diarrhea, or other symptoms.  She denies pertinent medical history.  The history is provided by the patient and medical records. A language interpreter was used.    Past Medical History:  Diagnosis Date  . Medical history non-contributory     Patient Active Problem List   Diagnosis Date Noted  . Nexplanon in place 07/23/2019  . Arrest of descent, delivered, current hospitalization 03/28/2019  . Lab test positive for detection of COVID-19 virus 03/28/2019  . Language barrier 03/28/2019  . Post term pregnancy at [redacted] weeks gestation 03/27/2019    Past Surgical History:  Procedure Laterality Date  . CESAREAN SECTION N/A 03/28/2019   Procedure: CESAREAN SECTION;  Surgeon: Adam Phenix, MD;  Location: [facility], [place] LD ORS;  Service: Obstetrics;  Laterality: N/A;  . NO PAST SURGERIES      OB History    Gravida  1   Para  1   Term  1   Preterm      AB      Living  1     SAB      IAB      Ectopic      Multiple  0   Live Births  1            Home Medications    Prior to Admission medications   Medication Sig Start Date End Date Taking? Authorizing Provider  predniSONE (STERAPRED UNI-PAK 21 TAB) 10 MG (21) TBPK tablet Take by mouth daily. As directed 04/28/20   Mickie Bail, NP  prenatal vitamin w/FE, FA (PRENATAL 1 + 1) 27-1 MG TABS tablet Take 1 tablet by mouth daily at 12 noon.    [provider]    Family History History reviewed. No pertinent  family history.  Social History Social History   Tobacco Use  . Smoking status: Never Smoker  . Smokeless tobacco: Never Used  Vaping Use  . Vaping Use: Never used  Substance Use Topics  . Alcohol use: Never  . Drug use: Never     Allergies   Patient has no known allergies.   Review of Systems Review of Systems  Constitutional: Negative for chills and fever.  HENT: Negative for ear pain and sore throat.   Eyes: Negative for pain and visual disturbance.  Respiratory: Negative for cough and shortness of breath.   Cardiovascular: Negative for chest pain and palpitations.  Gastrointestinal: Negative for abdominal pain, diarrhea and vomiting.  Genitourinary: Negative for dysuria and hematuria.  Musculoskeletal: Negative for arthralgias and back pain.  Skin: Positive for rash. Negative for color change.  Neurological: Negative for seizures and syncope.  All other systems reviewed and are negative.    Physical Exam Triage Vital Signs ED Triage Vitals  Enc Vitals Group     BP 04/28/20 1604 109/70     Pulse Rate 04/28/20 1604 66     Resp 04/28/20 1604 18     Temp  04/28/20 1604 98.3 F (36.8 C)     Temp Source 04/28/20 1604 Oral     SpO2 04/28/20 1604 99 %     Weight --      Height --      Head Circumference --      Peak Flow --      Pain Score 04/28/20 1602 0     Pain Loc --      Pain Edu? --      Excl. in GC? --    No data found.  Updated Vital Signs BP 109/70 (BP Location: Right Arm)   Pulse 66   Temp 98.3 F (36.8 C) (Oral)   Resp 18   LMP 03/20/2020 (Approximate)   SpO2 99%   Breastfeeding No   Visual Acuity Right Eye Distance:   Left Eye Distance:   Bilateral Distance:    Right Eye Near:   Left Eye Near:    Bilateral Near:     Physical Exam Vitals and nursing note reviewed.  Constitutional:      General: She is not in acute distress.    Appearance: She is well-developed and well-nourished. She is not ill-appearing.  HENT:     Head:  Normocephalic and atraumatic.     Mouth/Throat:     Mouth: Mucous membranes are moist.     Pharynx: Oropharynx is clear.     Comments: Speech clear.  No oropharyngeal swelling.  No difficulty swallowing. Eyes:     Conjunctiva/sclera: Conjunctivae normal.  Cardiovascular:     Rate and Rhythm: Normal rate and regular rhythm.     Heart sounds: No murmur heard.   Pulmonary:     Effort: Pulmonary effort is normal. No respiratory distress.     Breath sounds: Normal breath sounds.  Abdominal:     Palpations: Abdomen is soft.     Tenderness: There is no abdominal tenderness. There is no guarding or rebound.  Musculoskeletal:        General: No swelling, tenderness or edema. Normal range of motion.     Cervical back: Neck supple.  Skin:    General: Skin is warm and dry.     Capillary Refill: Capillary refill takes less than 2 seconds.     Findings: Rash present.     Comments: Pink patchy rash on trunk and extremities.  No open lesions or drainage.  Neurological:     General: No focal deficit present.     Mental Status: She is alert.     Gait: Gait normal.  Psychiatric:        Mood and Affect: Mood and affect and mood normal.        Behavior: Behavior normal.      UC Treatments / Results  Labs (all labs ordered are listed, but only abnormal results are displayed) Labs Reviewed - No data to display  EKG   Radiology No results found.  Procedures Procedures (including critical care time)  Medications Ordered in UC Medications - No data to display  Initial Impression / Assessment and Plan / UC Course  I have reviewed the triage vital signs and the nursing notes.  Pertinent labs & imaging results that were available during my care of the patient were reviewed by me and considered in my medical decision making (see chart for details).   Rash.  Treated with prednisone and Benadryl.  Precautions for drowsiness with Benadryl discussed.  Instructed patient to take Claritin  during the day instead of Benadryl if she  needs to be awake and alert.  Instructed her to follow-up with her PCP if her symptoms are not improving.  She agrees to plan of care.   Final Clinical Impressions(s) / UC Diagnoses   Final diagnoses:  Rash     Discharge Instructions     Take the prednisone as directed.   Take Benadryl every 6 hours as directed; do not drive, operate machinery, or drink alcohol with this medication as it may cause drowsiness.  If you need to be awake and alert, take Claritin as directed.    Follow up with your primary care provider if your symptoms are not improving.        ED Prescriptions    Medication Sig Dispense Auth. Provider   predniSONE (STERAPRED UNI-PAK 21 TAB) 10 MG (21) TBPK tablet Take by mouth daily. As directed 21 tablet Mickie Bail, NP     PDMP not reviewed this encounter.   Mickie Bail, NP 04/28/20 1654

## 2020-04-28 NOTE — Discharge Instructions (Signed)
Take the prednisone as directed.    Take Benadryl every 6 hours as directed; do not drive, operate machinery, or drink alcohol with this medication as it may cause drowsiness.  If you need to be awake and alert, take Claritin as directed.    Follow up with your primary care provider if your symptoms are not improving.     

## 2020-04-28 NOTE — ED Triage Notes (Signed)
Pt states that she has a breakout that started a few days ago. Pt states they are not pain or itchy. Located on her stomach, arms, and legs.

## 2021-11-27 ENCOUNTER — Encounter (HOSPITAL_COMMUNITY): Payer: Self-pay | Admitting: Emergency Medicine

## 2021-11-27 ENCOUNTER — Ambulatory Visit (HOSPITAL_COMMUNITY)
Admission: EM | Admit: 2021-11-27 | Discharge: 2021-11-27 | Disposition: A | Discharge status: Home / Self Care | Payer: Medicaid Other | Attending: Family Medicine | Admitting: Family Medicine

## 2021-11-27 ENCOUNTER — Ambulatory Visit (HOSPITAL_COMMUNITY): Payer: Medicaid Other

## 2021-11-27 ENCOUNTER — Ambulatory Visit (INDEPENDENT_AMBULATORY_CARE_PROVIDER_SITE_OTHER): Payer: Medicaid Other

## 2021-11-27 DIAGNOSIS — M25551 Pain in right hip: Secondary | ICD-10-CM

## 2021-11-27 DIAGNOSIS — M545 Low back pain, unspecified: Secondary | ICD-10-CM

## 2021-11-27 DIAGNOSIS — M25552 Pain in left hip: Secondary | ICD-10-CM

## 2021-11-27 MED ORDER — CYCLOBENZAPRINE HCL 10 MG PO TABS: 10.0000 mg | ORAL_TABLET | Freq: Every day | ORAL | 0 refills | Status: DC

## 2021-11-27 MED ORDER — NAPROXEN SODIUM 550 MG PO TABS: 550.0000 mg | ORAL_TABLET | Freq: Two times a day (BID) | ORAL | 0 refills | Status: DC

## 2021-11-27 NOTE — Discharge Instructions (Signed)
??????? ????? ?????? ??????????? ?? ??? ?? ???? ???  ?????? ??? ? ??????? ????-?? ??????? ?, ??????? ????? ??????? ? ??? ???????? ????????? ???, ?????? ????? MyChart ?? ??? ??????? ???????? ????? ??????????  ???????? ???????? ?????? ??????? ???? ???? ????? ??????? ?, ????? ???????? ????? ???? ????? ???? ?? ??????  ???????? ???? ????? ? ???? ???? ? ??????? naproxen ???????? ??????? ???????? ?????? ?????? ???? ??????  ??????? ?????? ????? ???????? ?????? ?????? ???? ?????????? ??????? ????????? ????? ????, ????? ???????? ???? ???????? ?????? ????? ????  ??????? ???????? ?????? ???? ?????? ???? 15 ????? ???????????? ?????? ????? ?????? ???? ??????????, ?? ??????? ???????? ????????? 10-15 ??????? ??? ?????? ????? ???? ???? ???????????  ?????????????? ?? ???? ???? ??? ????? ??? ?? ??????? ???? ????? ???????? ??????? ??????? ???? ?????????  ?????? ???????? ???? ?? ? ??????? ????? ????? ??????????  ????? ??????? ????? ???? ?????? ?????? ???? ??????????  ?????? ?????? ?????? ?????????: ????? ?????, ???? ?????, ???? ?????, ?????? ????? ? ??? ???? ???????? ???? ????? ???????  ??? ??????? ?????? ????? ??? ????? ???????? ?? ???: ??????????? ??? ???????????? ???? ?????????? ??????????? ??????? ??????? ??? ???? ???, ?? ???????? ?????????? ???????? ??? ? ??????, ???? ?? ??????? ?????? ????? ??????????? ??? ?????????? ????? ??????? ???????? ?????????? ???? ???????  Tap?'??k? dukh?'i pr?ya? m?nsap??iharu k? jalana k? k?ra?a h?.  Tap?'?k? hipa ra pach??ik? ?ksa-r? p?n?i?a cha, tap?'?l?'? m?tra b?l?'in? cha yadi natij?har? sambandhita chan, tap?'?l? ?phn? MyChart m? sabai par?k?a?a natij?har? d?khna saknuhuncha  tap?'?nl?'? pr?thamika ??k?ara kh?jnak? l?gi ?'u?? r?pharala r?khi'?k? cha, kasail? tap?'?nl?'? tyas? garna maddata garna kala garn?cha  bh?lid?khi har?ka bih?na ra har?ka s?m?jha 5 dinasam'ma naproxen linuh?s tyasapachi ?va?yakat? anus?ra pray?ga garna sakincha  tap?'?nl? sutn? samayam? m?nsap???  ?r?mak? pray?ga garna saknuhuncha tap?'??k? asuvidh?m? maddata garna, dhy?na dinuh?s yasal? tap?'?nl?'? nidr? l?gna sakcha  tap?'?nl? atirikta ?r?mak? l?gi ?va?yaka bha'?m? 15 min??a antar?lahar?m? tat?'un? py??a pray?ga garna saknuhuncha, v? tap?'?nl? prabh?vita k??tram? 10-15 min??am? barapha pray?ga gard? ?r?ma p?'una saknuhuncha.  M?nsap???har?l?'? thapa ?hil? garna sahana sakn? gar? 10 min??ak? l?gi dainika prabh?vita k??tra tank?'una suru garnuh?s  sutd? sap?r?ak? l?gi tala ra ghum???k? b?cam? takiy? r?khnuh?s  baliy? gadd?m? takiy? bin? sutn? pray?sa garna saknuhuncha  r?mr? mudr? abhy?sa garnuh?s: ??'[place]? pach??i, k?m?dha pach??i, ch?t? ag??i, ?r??i pach??i ra vajana duvai khu???m? sam?na r?pam? vitarita.  Yadi siph?risa gari'?k? upac?ra pachi dukh?'i rahirahancha v? puna: D?h?ry?'um?cha yadi m?ly??kanak? l?gi arth?p??ika vi???ajasam?ga pachy?'una l?bhad?yaka huna sakcha bhan?, y? ??k?aral? ha??'??har?m? vi???aja chan ra im?ji?a, au?adhi v? ??r?rika th?r?p? jast? vikalpahar?k? s?tha d?rghak?l?na r?pam? tap?'??k? lak?a?ahar? vyavasth?pana garna sakchan.   Your pain is most likely caused by irritation to the muscles.  Your x-rays of your hip and back is pending, you will be called only if results are concerning, you may see all test results on your MyChart  A referral has been placed for you to find a primary doctor, someone will call you to help you do so  Starting tomorrow take naproxen every morning and every evening for 5 days then may use as needed  You may use muscle relaxer at bedtime to help with your discomfort, be mindful this may make you drowsy  You may use heating pad in 15 minute intervals as needed for additional comfort, or you may find comfort in using ice in 10-15 minutes over affected area  Begin stretching affected area daily for 10 minutes as tolerated to further loosen muscles   When lying down place pillow underneath and between knees for support  Can try  sleeping without pillow on firm mattress  Practice good posture: head back, shoulders back, chest forward, pelvis back and weight distributed evenly on both legs  If pain persist after recommended treatment or reoccurs if may be beneficial to follow up with orthopedic specialist for evaluation, this doctor specializes in the bones and can manage your symptoms long-term with options such as but not limited to imaging, medications or physical therapy

## 2021-11-27 NOTE — ED Triage Notes (Signed)
C/o bilat hip and back pains for 3 days. Denies falls or injuries or lifting. P reports when coughs pain hurts wore.

## 2021-11-27 NOTE — ED Provider Notes (Signed)
[facility]    CSN: [id number] Arrival date & time: 11/27/21  1810      History   Chief Complaint Chief Complaint  Patient presents with   Back Pain   Hip Pain    HPI Rebekah Martinez is a 36 y.o. female.   Patient presents with bilateral hip pain and lower back pain beginning 3 days ago.  Symptoms started abruptly without precipitating event, trauma or injury.  Pain has been constant, rating at 8 out of 10, described as bone pain.  Symptoms are worse when sitting.  Range of motion of the back and the hips are intact.  Denies numbness, tingling, abdominal symptoms, vaginal or urinary symptoms.  Has not attempted treatment.   Declined use of formal interpreter.  All interpretation completed by family member. Past Medical History:  Diagnosis Date   Medical history non-contributory     Patient Active Problem List   Diagnosis Date Noted   Nexplanon in place 07/23/2019   Arrest of descent, delivered, current hospitalization 03/28/2019   Lab test positive for detection of COVID-19 virus 03/28/2019   Language barrier 03/28/2019   Post term pregnancy at [redacted] weeks gestation 03/27/2019    Past Surgical History:  Procedure Laterality Date   CESAREAN SECTION N/A 03/28/2019   Procedure: CESAREAN SECTION;  Surgeon: Adam Phenix, MD;  Location: [facility] LD [facility]S;  Service: Obstetrics;  Laterality: N/A;   NO PAST SURGERIES      OB History     Gravida  1   Para  1   Term  1   Preterm      AB      Living  1      SAB      IAB      Ectopic      Multiple  0   Live Births  1            Home Medications    Prior to Admission medications   Medication Sig Start Date End Date Taking? Authorizing Provider  predniSONE (STERAPRED UNI-PAK 21 TAB) 10 MG (21) TBPK tablet Take by mouth daily. As directed 04/28/20   Mickie Bail, NP  prenatal vitamin w/FE, FA (PRENATAL 1 + 1) 27-1 MG TABS tablet Take 1 tablet by mouth daily at 12 noon.    [provider]     Family History No family history on file.  Social History Social History   Tobacco Use   Smoking status: Never   Smokeless tobacco: Never  Vaping Use   Vaping Use: Never used  Substance Use Topics   Alcohol use: Never   Drug use: Never     Allergies   Patient has no known allergies.   Review of Systems Review of Systems  Constitutional: Negative.   Respiratory: Negative.    Cardiovascular: Negative.   Musculoskeletal:  Positive for back pain and myalgias. Negative for arthralgias, gait problem, joint swelling and neck stiffness.  Skin: Negative.   Neurological: Negative.      Physical Exam Triage Vital Signs ED Triage Vitals  Enc Vitals Group     BP 11/27/21 1912 110/67     Pulse Rate 11/27/21 1912 77     Resp 11/27/21 1912 16     Temp 11/27/21 1912 98.8 F (37.1 C)     Temp Source 11/27/21 1912 Oral     SpO2 11/27/21 1912 98 %     Weight --      Height --  Head Circumference --      Peak Flow --      Pain Score 11/27/21 1909 8     Pain Loc --      Pain Edu? --      Excl. in GC? --    No data found.  Updated Vital Signs BP 110/67 (BP Location: Left Arm)   Pulse 77   Temp 98.8 F (37.1 C) (Oral)   Resp 16   SpO2 98%   Visual Acuity Right Eye Distance:   Left Eye Distance:   Bilateral Distance:    Right Eye Near:   Left Eye Near:    Bilateral Near:     Physical Exam Constitutional:      Appearance: Normal appearance.  HENT:     Head: Normocephalic.  Eyes:     Extraocular Movements: Extraocular movements intact.  Musculoskeletal:     Comments: Tenderness is present in the bilateral groins, no tenderness directly over the hip joints, pain is elicited with abduction and abduction of the hip joint bilaterally, sensation is intact, 2+ femoral pulses, able to bear weight to the lower extremities  Tenderness is felt along the midline of the lower back, no ecchymosis, swelling or deformity noted, able to twist turn and bend   Neurological:     Mental Status: She is alert and oriented to person, place, and time.  Psychiatric:        Mood and Affect: Mood normal.        Behavior: Behavior normal.      UC Treatments / Results  Labs (all labs ordered are listed, but only abnormal results are displayed) Labs Reviewed - No data to display  EKG   Radiology No results found.  Procedures Procedures (including critical care time)  Medications Ordered in UC Medications - No data to display  Initial Impression / Assessment and Plan / UC Course  I have reviewed the triage vital signs and the nursing notes.  Pertinent labs & imaging results that were available during my care of the patient were reviewed by me and considered in my medical decision making (see chart for details).  Acute hip pain, bilateral, acute midline low back pain without sciatica  Etiology is most likely muscular, discussed with patient, patient persistently requesting imaging, low suspicion for bone involvement however will oblige, x-rays of the bilateral hips and lumbar spine pending, prescribed naproxen and Flexeril for outpatient management, recommended RICE, heat, pillows for support, daily stretching and activity as tolerated, given walker referral to orthopedics if symptoms persist or worsen Final Clinical Impressions(s) / UC Diagnoses   Final diagnoses:  None   Discharge Instructions   None    ED Prescriptions   None    PDMP not reviewed this encounter.   Valinda Hoar, NP 11/27/21 1953

## 2021-11-30 ENCOUNTER — Other Ambulatory Visit: Payer: Self-pay

## 2021-11-30 ENCOUNTER — Emergency Department (HOSPITAL_COMMUNITY)
Admission: EM | Admit: 2021-11-30 | Discharge: 2021-11-30 | Disposition: A | Discharge status: Home / Self Care | Payer: Medicaid Other | Attending: Emergency Medicine | Admitting: Emergency Medicine

## 2021-11-30 ENCOUNTER — Encounter (HOSPITAL_COMMUNITY): Payer: Self-pay | Admitting: Emergency Medicine

## 2021-11-30 DIAGNOSIS — M545 Low back pain, unspecified: Secondary | ICD-10-CM | POA: Diagnosis present

## 2021-11-30 DIAGNOSIS — M5459 Other low back pain: Secondary | ICD-10-CM | POA: Diagnosis not present

## 2021-11-30 NOTE — ED Triage Notes (Signed)
Patient here for evaluation of back and hip pain, seen for same at urgent care recently for same. Patient states pain has gotten worse. Denies urinary and stool incontinence, denies weakness.

## 2021-11-30 NOTE — Discharge Instructions (Signed)
You came to the emergency department today to be evaluated for your back pain.  Your physical exam was reassuring.  Please take the Flexeril medication as well as the naproxen medication prescribed by the urgent care provider.  Additionally please take Tylenol as listed below.  If your pain does not improve please follow-up with the orthopedic provider listed on this paperwork for further evaluation.  Please take Tylenol (acetaminophen) to relieve your pain.  You make take tylenol, up to 1,000 mg (two extra strength pills) every 8 hours as needed.  Do not take more than 3,000 mg tylenol in a 24 hour period (not more than one dose every 8 hours.  Please check all medication labels as many medications such as pain and cold medications may contain tylenol.  Do not drink alcohol while taking these medications.  Get help right away if: You develop new bowel or bladder control problems. You have unusual weakness or numbness in your arms or legs. You feel faint.

## 2021-11-30 NOTE — ED Provider Notes (Signed)
[facility] EMERGENCY DEPARTMENT Provider Note   CSN: [id number] Arrival date & time: 11/30/21  1538     History  Chief Complaint  Patient presents with   Back Pain    Rebekah Martinez is a 36 y.o. female with no pertinent past medical history.  Presents to the emergency department with a chief complaint of bilateral lumbar back pain.  Patient reports that pain began approximately July 14.  Pain has been constant since then.  Pain has not gotten any better or any worse.  Patient reports that pain is worse when in a seated position.  Patient has been taking Flexeril medication with minimal improvement in her symptoms.  Patient has not been taking any other medications to help with her pain.  Patient denies any recent falls, traumatic injuries, heavy lifting, IV drug use, history of malignancy.  Patient denies any fevers, chills, numbness, weakness, saddle anesthesia, bowel/bladder dysfunction.    Back Pain Associated symptoms: no abdominal pain, no chest pain, no dysuria, no fever, no headaches, no numbness and no weakness        Home Medications Prior to Admission medications   Medication Sig Start Date End Date Taking? Authorizing Provider  cyclobenzaprine (FLEXERIL) 10 MG tablet Take 1 tablet (10 mg total) by mouth at bedtime. 11/27/21   White, Elita Boone, NP  naproxen sodium (ANAPROX DS) 550 MG tablet Take 1 tablet (550 mg total) by mouth 2 (two) times daily with a meal. 11/27/21   White, Elita Boone, NP  prenatal vitamin w/FE, FA (PRENATAL 1 + 1) 27-1 MG TABS tablet Take 1 tablet by mouth daily at 12 noon.    [provider]      Allergies    Patient has no known allergies.    Review of Systems   Review of Systems  Constitutional:  Negative for chills and fever.  Eyes:  Negative for visual disturbance.  Respiratory:  Negative for shortness of breath.   Cardiovascular:  Negative for chest pain.  Gastrointestinal:  Negative for abdominal pain, nausea  and vomiting.  Genitourinary:  Negative for difficulty urinating, dysuria, hematuria and urgency.  Musculoskeletal:  Positive for back pain. Negative for neck pain and neck stiffness.  Skin:  Negative for color change and rash.  Neurological:  Negative for dizziness, syncope, weakness, light-headedness, numbness and headaches.  Psychiatric/Behavioral:  Negative for confusion.     Physical Exam Updated Vital Signs BP 111/70 (BP Location: Left Arm)   Pulse 78   Temp 98.7 F (37.1 C) (Oral)   Resp 16   SpO2 100%  Physical Exam Vitals and nursing note reviewed.  Constitutional:      General: She is not in acute distress.    Appearance: She is not ill-appearing, toxic-appearing or diaphoretic.  HENT:     Head: Normocephalic.  Eyes:     General: No scleral icterus.       Right eye: No discharge.        Left eye: No discharge.  Cardiovascular:     Rate and Rhythm: Normal rate.     Pulses:          Dorsalis pedis pulses are 2+ on the right side and 2+ on the left side.  Pulmonary:     Effort: Pulmonary effort is normal.  Musculoskeletal:     Cervical back: No swelling, edema, deformity, erythema, signs of trauma, lacerations, rigidity, spasms, torticollis, tenderness, bony tenderness or crepitus. No pain with movement. Normal range of motion.  Thoracic back: No swelling, edema, deformity, signs of trauma, lacerations, spasms, tenderness or bony tenderness.     Lumbar back: Tenderness present. No swelling, edema, deformity, signs of trauma, lacerations, spasms or bony tenderness. Negative right straight leg raise test and negative left straight leg raise test.     Comments: No midline tenderness or deformity to cervical, thoracic, lumbar spine.  Patient has tenderness to bilateral lumbar back.  Patient is able to stand and ambulate without difficulty.  +5 strength to bilateral upper extremities.  Skin:    General: Skin is warm and dry.  Neurological:     General: No focal deficit  present.     Mental Status: She is alert.  Psychiatric:        Behavior: Behavior is cooperative.     ED Results / Procedures / Treatments   Labs (all labs ordered are listed, but only abnormal results are displayed) Labs Reviewed - No data to display  EKG None  Radiology No results found.  Procedures Procedures    Medications Ordered in ED Medications - No data to display  ED Course/ Medical Decision Making/ A&P                           Medical Decision Making  Alert 36 year old female in no acute distress, nontoxic-appearing.  Presents to the ED with a chief complaint of back pain.  Information was obtained from patient and patient's husband at bedside.  Patient was offered interpreter however requested husband act as interpreter.  I reviewed patient's past medical records including previous prior notes and imaging.  Per chart review patient was seen at urgent care on 11/27/2021.  Patient had x-ray imaging of lumbar spine and hips bilaterally which showed no acute osseous abnormality.  Presents today with continued pain.  Pain has not become any worse.  Patient has been taking Flexeril medication with minimal improvement in her symptoms.  Patient has not been taking other medications to help with her pain.  Denies any recent falls or traumatic injuries; low suspicion for acute osseous abnormality.  Patient denies any history of IV drug use, is afebrile; low suspicion for epidural abscess.  Patient has no focal neurological deficits and denies any bowel/bladder dysfunction as well as saddle anesthesia; low suspicion for cauda equina syndrome at this time.  Suspect that patient's pain is musculoskeletal in nature and should gradually improve over time.  Discussed symptomatic treatment with Tylenol and ibuprofen or the naproxen that she was prescribed.  Additionally will give patient information to follow-up with orthopedic provider if pain does not improve.  Based on patient's  chief complaint, I considered admission might be necessary, however after reassuring ED workup feel patient is reasonable for discharge.  Discussed results, findings, treatment and follow up. Patient advised of return precautions. Patient verbalized understanding and agreed with plan.  Portions of this note were generated with Scientist, clinical (histocompatibility and immunogenetics). Dictation errors may occur despite best attempts at proofreading.         Final Clinical Impression(s) / ED Diagnoses Final diagnoses:  Acute bilateral low back pain without sciatica    Rx / DC Orders ED Discharge Orders     None         Haskel Schroeder, PA-C 11/30/21 1600    Margarita Grizzle, MD 12/01/21 2248

## 2022-01-03 ENCOUNTER — Ambulatory Visit: Payer: Medicaid Other | Admitting: Internal Medicine

## 2022-01-03 ENCOUNTER — Ambulatory Visit: Payer: Medicaid Other | Admitting: Family Medicine

## 2022-01-10 ENCOUNTER — Ambulatory Visit: Payer: Medicaid Other | Admitting: Internal Medicine

## 2022-03-22 ENCOUNTER — Ambulatory Visit (INDEPENDENT_AMBULATORY_CARE_PROVIDER_SITE_OTHER): Payer: Medicaid Other | Admitting: Nurse Practitioner

## 2022-03-22 ENCOUNTER — Encounter: Payer: Self-pay | Admitting: Nurse Practitioner

## 2022-03-22 VITALS — BP 111/76 | HR 73 | Ht 59.0 in | Wt 123.0 lb

## 2022-03-22 DIAGNOSIS — M545 Low back pain, unspecified: Secondary | ICD-10-CM | POA: Insufficient documentation

## 2022-03-22 DIAGNOSIS — Z0000 Encounter for general adult medical examination without abnormal findings: Secondary | ICD-10-CM

## 2022-03-22 DIAGNOSIS — Z1329 Encounter for screening for other suspected endocrine disorder: Secondary | ICD-10-CM

## 2022-03-22 DIAGNOSIS — Z13220 Encounter for screening for lipoid disorders: Secondary | ICD-10-CM

## 2022-03-22 DIAGNOSIS — G8929 Other chronic pain: Secondary | ICD-10-CM | POA: Diagnosis not present

## 2022-03-22 DIAGNOSIS — R2240 Localized swelling, mass and lump, unspecified lower limb: Secondary | ICD-10-CM | POA: Diagnosis not present

## 2022-03-22 LAB — POCT URINALYSIS DIPSTICK
Bilirubin, UA: NEGATIVE
Blood, UA: NEGATIVE
Glucose, UA: NEGATIVE
Ketones, UA: NEGATIVE
Leukocytes, UA: NEGATIVE
Nitrite, UA: NEGATIVE
Protein, UA: NEGATIVE
Spec Grav, UA: >=1.03 — AB (ref 1.010–1.025)
Urobilinogen, UA: 0.2 E.U./dL
pH, UA: 7 (ref 5.0–8.0)

## 2022-03-22 MED ORDER — PREDNISONE 20 MG PO TABS: 20.0000 mg | ORAL_TABLET | Freq: Every day | ORAL | 0 refills | Status: AC

## 2022-03-22 NOTE — Progress Notes (Signed)
@Patient  ID: Rebekah Martinez, female    DOB: Jun 14, 1985, 36 y.o.   MRN: [id number]  Chief Complaint  Patient presents with   New Patient (Initial Visit)    Pt stated-lower back especially walking/sitting-6 months    Referring provider: No ref. provider found   HPI  Patient presents today to establish care.  She is wanting complete physical today.  She does have a history of low back pain and was seen in the ED for this on 11/30/2021.  She was treated with Flexeril.  She no longer has this prescription and has not been taking this medication.  She states that she continues to have low back pain.  Imaging was negative in ED.  We will refer her for physical therapy and trial prednisone. Denies f/c/s, n/v/d, hemoptysis, PND, leg swelling Denies chest pain or edema.      No Known Allergies  Immunization History  Administered Date(s) Administered   Influenza-Unspecified 10/27/2018   Tdap 12/28/2018    Past Medical History:  Diagnosis Date   Medical history non-contributory     Tobacco History: Social History   Tobacco Use  Smoking Status Never  Smokeless Tobacco Never   Counseling given: Not Answered   Outpatient Encounter Medications as of 03/22/2022  Medication Sig   predniSONE (DELTASONE) 20 MG tablet Take 1 tablet (20 mg total) by mouth daily with breakfast for 5 days.   [DISCONTINUED] cyclobenzaprine (FLEXERIL) 10 MG tablet Take 1 tablet (10 mg total) by mouth at bedtime.   [DISCONTINUED] naproxen sodium (ANAPROX DS) 550 MG tablet Take 1 tablet (550 mg total) by mouth 2 (two) times daily with a meal.   [DISCONTINUED] prenatal vitamin w/FE, FA (PRENATAL 1 + 1) 27-1 MG TABS tablet Take 1 tablet by mouth daily at 12 noon.   No facility-administered encounter medications on file as of 03/22/2022.     Review of Systems  Review of Systems  Constitutional: Negative.   HENT: Negative.    Cardiovascular: Negative.   Gastrointestinal: Negative.   Musculoskeletal:   Positive for back pain.  Allergic/Immunologic: Negative.   Neurological: Negative.   Psychiatric/Behavioral: Negative.         Physical Exam  BP 111/76   Pulse 73   Ht 4\' 11"  (1.499 m)   Wt 123 lb (55.8 kg)   SpO2 98%   BMI 24.84 kg/m   Wt Readings from Last 5 Encounters:  03/22/22 123 lb (55.8 kg)  07/23/19 119 lb (54 kg)  04/16/19 114 lb 8 oz (51.9 kg)  03/27/19 138 lb (62.6 kg)  03/05/19 138 lb (62.6 kg)     Physical Exam Vitals and nursing note reviewed.  Constitutional:      General: She is not in acute distress.    Appearance: She is well-developed.  Cardiovascular:     Rate and Rhythm: Normal rate and regular rhythm.  Pulmonary:     Effort: Pulmonary effort is normal.     Breath sounds: Normal breath sounds.  Neurological:     Mental Status: She is alert and oriented to person, place, and time.      Lab Results:  CBC    Component Value Date/Time   WBC 23.2 (H) 03/29/2019 0434   RBC 3.61 (L) 03/29/2019 0434   HGB 11.1 (L) 03/29/2019 0434   HCT 33.8 (L) 03/29/2019 0434   PLT 224 03/29/2019 0434   MCV 93.6 03/29/2019 0434   MCH 30.7 03/29/2019 0434   MCHC 32.8 03/29/2019 0434   RDW 14.3  03/29/2019 0434    BMET    Component Value Date/Time   CREATININE 1.09 (H) 03/29/2019 0434   GFRNONAA >60 03/29/2019 0434   GFRAA >60 03/29/2019 0434     Assessment & Plan:   Chronic bilateral low back pain without sciatica - predniSONE (DELTASONE) 20 MG tablet; Take 1 tablet (20 mg total) by mouth daily with breakfast for 5 days.  Dispense: 5 tablet; Refill: 0 - Ambulatory referral to Physical Therapy - POCT URINALYSIS DIP (CLINITEK)  2. Thyroid disorder screen  - CBC - Comprehensive metabolic panel - TSH  3. Lipid screening  - Lipid Panel  4. Routine adult health maintenance  - CBC - Lipid Panel - Comprehensive metabolic panel - POCT URINALYSIS DIP (CLINITEK) - TSH  Follow up:  Follow up in 1 year or sooner if needed     Ivonne Andrew, NP 03/22/2022

## 2022-03-22 NOTE — Patient Instructions (Signed)
1. Chronic bilateral low back pain without sciatica  - predniSONE (DELTASONE) 20 MG tablet; Take 1 tablet (20 mg total) by mouth daily with breakfast for 5 days.  Dispense: 5 tablet; Refill: 0 - Ambulatory referral to Physical Therapy - POCT URINALYSIS DIP (CLINITEK)  2. Thyroid disorder screen  - CBC - Comprehensive metabolic panel - TSH  3. Lipid screening  - Lipid Panel  4. Routine adult health maintenance  - CBC - Lipid Panel - Comprehensive metabolic panel - POCT URINALYSIS DIP (CLINITEK) - TSH  Follow up:  Follow up in 1 year or sooner if needed

## 2022-03-22 NOTE — Assessment & Plan Note (Signed)
-   predniSONE (DELTASONE) 20 MG tablet; Take 1 tablet (20 mg total) by mouth daily with breakfast for 5 days.  Dispense: 5 tablet; Refill: 0 - Ambulatory referral to Physical Therapy - POCT URINALYSIS DIP (CLINITEK)  2. Thyroid disorder screen  - CBC - Comprehensive metabolic panel - TSH  3. Lipid screening  - Lipid Panel  4. Routine adult health maintenance  - CBC - Lipid Panel - Comprehensive metabolic panel - POCT URINALYSIS DIP (CLINITEK) - TSH  Follow up:  Follow up in 1 year or sooner if needed

## 2022-03-23 LAB — CBC
Hematocrit: 41 % (ref 34.0–46.6)
Hemoglobin: 13.8 g/dL (ref 11.1–15.9)
MCH: 30.4 pg (ref 26.6–33.0)
MCHC: 33.7 g/dL (ref 31.5–35.7)
MCV: 90 fL (ref 79–97)
Platelets: 306 10*3/uL (ref 150–450)
RBC: 4.54 x10E6/uL (ref 3.77–5.28)
RDW: 12.2 % (ref 11.7–15.4)
WBC: 6.4 10*3/uL (ref 3.4–10.8)

## 2022-03-23 LAB — LIPID PANEL
Chol/HDL Ratio: 3 ratio (ref 0.0–4.4)
Cholesterol, Total: 165 mg/dL (ref 100–199)
HDL: 55 mg/dL (ref >39)
LDL Chol Calc (NIH): 88 mg/dL (ref 0–99)
Triglycerides: 123 mg/dL (ref 0–149)
VLDL Cholesterol Cal: 22 mg/dL (ref 5–40)

## 2022-03-23 LAB — COMPREHENSIVE METABOLIC PANEL
ALT: 23 IU/L (ref 0–32)
AST: 16 IU/L (ref 0–40)
Albumin/Globulin Ratio: 1.7 (ref 1.2–2.2)
Albumin: 4.8 g/dL (ref 3.9–4.9)
Alkaline Phosphatase: 70 IU/L (ref 44–121)
BUN/Creatinine Ratio: 16 (ref 9–23)
BUN: 12 mg/dL (ref 6–20)
Bilirubin Total: 0.2 mg/dL (ref 0.0–1.2)
CO2: 24 mmol/L (ref 20–29)
Calcium: 9.9 mg/dL (ref 8.7–10.2)
Chloride: 103 mmol/L (ref 96–106)
Creatinine, Ser: 0.77 mg/dL (ref 0.57–1.00)
Globulin, Total: 2.8 g/dL (ref 1.5–4.5)
Glucose: 91 mg/dL (ref 70–99)
Potassium: 3.7 mmol/L (ref 3.5–5.2)
Sodium: 140 mmol/L (ref 134–144)
Total Protein: 7.6 g/dL (ref 6.0–8.5)
eGFR: 104 mL/min/{1.73_m2} (ref >59)

## 2022-03-23 LAB — TSH: TSH: 0.853 u[IU]/mL (ref 0.450–4.500)

## 2022-03-25 ENCOUNTER — Encounter: Payer: Self-pay | Admitting: *Deleted

## 2022-03-29 ENCOUNTER — Encounter: Payer: Self-pay | Admitting: Physical Therapy

## 2022-03-29 ENCOUNTER — Ambulatory Visit: Payer: Medicaid Other | Attending: Nurse Practitioner | Admitting: Physical Therapy

## 2022-03-29 DIAGNOSIS — R252 Cramp and spasm: Secondary | ICD-10-CM | POA: Insufficient documentation

## 2022-03-29 DIAGNOSIS — R293 Abnormal posture: Secondary | ICD-10-CM | POA: Insufficient documentation

## 2022-03-29 DIAGNOSIS — M6281 Muscle weakness (generalized): Secondary | ICD-10-CM | POA: Insufficient documentation

## 2022-03-29 DIAGNOSIS — M545 Low back pain, unspecified: Secondary | ICD-10-CM | POA: Insufficient documentation

## 2022-03-29 DIAGNOSIS — G8929 Other chronic pain: Secondary | ICD-10-CM | POA: Insufficient documentation

## 2022-03-29 DIAGNOSIS — M5459 Other low back pain: Secondary | ICD-10-CM | POA: Diagnosis not present

## 2022-03-29 NOTE — Therapy (Addendum)
OUTPATIENT PHYSICAL THERAPY THORACOLUMBAR EVALUATION   Patient Name: Rebekah Martinez MRN: [id number] DOB:07/19/1985, 36 y.o., female Today's Date: 03/29/2022  END OF SESSION:  PT End of Session - 03/29/22 1336     Visit Number 1    Number of Visits 4    Date for PT Re-Evaluation 05/24/22    [facility]    Authorization Time Period 03/29/22 to 05/24/22    PT Start Time 1232    PT Stop Time 1310    PT Time Calculation (min) 38 min    Activity Tolerance Patient tolerated treatment well    Behavior During Therapy [facility] for tasks assessed/performed             Past Medical History:  Diagnosis Date   Medical history non-contributory    Past Surgical History:  Procedure Laterality Date   CESAREAN SECTION N/A 03/28/2019   Procedure: CESAREAN SECTION;  Surgeon: Adam PhenixArnold, James G, MD;  Location: [facility] LD ORS;  Service: Obstetrics;  Laterality: N/A;   NO PAST SURGERIES     Patient Active Problem List   Diagnosis Date Noted   Chronic bilateral low back pain without sciatica 03/22/2022   Nexplanon in place 07/23/2019   Arrest of descent, delivered, current hospitalization 03/28/2019   Lab test positive for detection of COVID-19 virus 03/28/2019   Language barrier 03/28/2019   Post term pregnancy at [redacted] weeks gestation 03/27/2019    PCP: No PCP   REFERRING PROVIDER: Ivonne AndrewNichols, Tonya S, NP  REFERRING DIAG:  M54.50,G89.29 (ICD-10-CM) - Chronic bilateral low back pain without sciatica    Rationale for Evaluation and Treatment: Rehabilitation  THERAPY DIAG:  Other low back pain - Plan: PT plan of care cert/re-cert  Muscle weakness (generalized) - Plan: PT plan of care cert/re-cert  Cramp and spasm - Plan: PT plan of care cert/re-cert  Abnormal posture - Plan: PT plan of care cert/re-cert  ONSET DATE: 03/22/2022  SUBJECTIVE:                                                                                                                                                                                            SUBJECTIVE STATEMENT:  The back is OK now because she has gotten some medicine, pain is better; a couple of months ago we went to urgent care and they gave us medicine that helps but after the medicine is done the pain comes back. This has been a problem for 6-7 months it just started on its own. No numbness or tingling.   PERTINENT HISTORY:  HPI   Patient presents today to establish care.  She is wanting complete  physical today.  She does have a history of low back pain and was seen in the ED for this on 11/30/2021.  She was treated with Flexeril.  She no longer has this prescription and has not been taking this medication.  She states that she continues to have low back pain.  Imaging was negative in ED.  We will refer her for physical therapy and trial prednisone. Denies f/c/s, n/v/d, hemoptysis, PND, leg swelling Denies chest pain or edema.     PAIN:  Are you having pain? Yes: NPRS scale: 2-3/10; at worst when not on medicine 7/10 Pain location: bilateral low back and both hips  Pain description: pressure  Aggravating factors: walking, physically carrying something, standing up and sitting down  Relieving factors: medicines   PRECAUTIONS: None  WEIGHT BEARING RESTRICTIONS: No  FALLS:  Has patient fallen in last 6 months? 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 There is no evidence of arthropathy or other focal bone  abnormality.   IMPRESSION: Negative.  FINDINGS: Five lumbar type vertebral bodies.   No acute fracture or subluxation. Vertebral body heights are preserved.   Alignment is normal.   Intervertebral disc spaces are maintained.   The sacroiliac joints are unremarkable.   IMPRESSION: Negative.  PATIENT SURVEYS:  Modified oswestry 15/50  SCREENING FOR RED FLAGS: Bowel or bladder incontinence: No Spinal tumors: No Cauda equina syndrome: No Compression fracture: No Abdominal aneurysm: No  COGNITION: Overall cognitive status: Within functional limits for tasks assessed     SENSATION: Not tested  MUSCLE LENGTH:  Quads WNL, HS WNL B, piriformis WNL B   POSTURE: rounded shoulders, forward head, decreased lumbar lordosis, increased thoracic kyphosis, and flexed trunk   PALPATION:  Severe trigger points and spasms noted B lumbar spine   LUMBAR ROM:   AROM eval  Flexion WNL, RFIS  no changes  Extension WNL if not slightly hypermobile, REIS    Right lateral flexion WNL   Left lateral flexion WNL   Right rotation   Left rotation    (Blank rows = not tested)  LOWER EXTREMITY ROM:     Active  Right eval Left eval  Hip flexion    Hip extension    Hip abduction    Hip adduction    Hip internal rotation    Hip external rotation    Knee flexion    Knee extension    Ankle dorsiflexion    Ankle plantarflexion    Ankle inversion    Ankle eversion     (Blank rows = not tested)  LOWER EXTREMITY MMT:    MMT Right eval Left eval  Hip flexion 4+ 4+  Hip extension 3 3  Hip abduction 3+ 4-  Hip adduction    Hip internal rotation    Hip external rotation    Knee flexion 4- 4  Knee extension 5 5  Ankle dorsiflexion    Ankle plantarflexion    Ankle inversion    Ankle eversion     (  Blank rows = not tested)  Core very weak- failed double leg lower test quickly   LUMBAR SPECIAL TESTS:  Straight leg raise test: Negative Double leg lower test for core (+)    FUNCTIONAL TESTS:    GAIT: Distance walked: in clinic distances  Assistive device utilized: None Level of assistance: Complete Independence Comments: WNL, + trendelenburg   TODAY'S TREATMENT:                                                                                                                              DATE:   03/29/22  Eval +appropriate education  TherEx  DKTC 3x30 seconds Lumbar rotation stretch 3x10 seconds B Hip flexor stretch R LE x1 (table not high enough for effective stretch) Bridge x10  PPT x10 with 3 second holds     PATIENT EDUCATION:  Education details: exam findings, POC, HEP; would likely see more progress with more regular sessions  Person educated: Patient and Spouse Education method: Explanation Education comprehension: verbalized understanding and returned demonstration  HOME EXERCISE PROGRAM:  Access Code: [email or web address] URL: [email or web address] Date: 03/29/2022 Prepared by: Nedra Hai   Exercises - Supine Double Knee to Chest  - 2 x daily - 7 x weekly - 1 sets - 3 reps - 30 hold - Supine Lower Trunk Rotation  - 2 x daily - 7 x weekly - 1 sets - 5 reps - 10 hold - Hip Flexor Stretch at Edge of Bed  - 2 x daily - 7 x weekly - 1 sets - 10 reps - Supine Bridge  - 2 x daily - 7 x weekly - 1 sets - 10 reps - 3 hold - Supine Posterior Pelvic Tilt  - 2 x daily - 7 x weekly - 1 sets - 10 reps - 3 hold  ASSESSMENT:  CLINICAL IMPRESSION: Patient is a 36 y.o. F  who was seen today for physical therapy evaluation and treatment for chronic low back pain. 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 Exam reveals significant functional muscle weakness, postural impairment, poor biomechanics, and severe mm spasms in lumbar region. Will benefit from skilled PT services to address impairments and reduce pain moving forward.    OBJECTIVE IMPAIRMENTS: decreased  strength, increased fascial restrictions, increased muscle spasms, postural dysfunction, and pain.   ACTIVITY LIMITATIONS: lifting, standing, stairs, transfers, and locomotion level  PARTICIPATION LIMITATIONS: cleaning, laundry, community activity, occupation, and yard work  PERSONAL FACTORS: Behavior pattern, Education, Fitness, and Time since onset of injury/illness/exacerbation are also affecting patient's functional outcome.   REHAB POTENTIAL: Fair requesting one appt at a time/schedule not consistent   CLINICAL DECISION MAKING: Stable/uncomplicated  EVALUATION COMPLEXITY: Low   GOALS: Goals reviewed with patient? Yes  SHORT TERM GOALS: Target date: 04/26/2022    Will be compliant with appropriate progressive HEP  Baseline: Goal status: INITIAL  2.  Pain to be no more than 4/10 at worst without medications  Baseline:  Goal status: INITIAL  3.  Will demonstrate better understanding of posture and biomechanics  Baseline:  Goal status: INITIAL  LONG TERM GOALS: Target date: 05/24/2022    MMT to improve by at least 1 grade in all weak groups  Baseline:  Goal status: INITIAL  2.  Pain to be no more than 2/10 at worst without medication  Baseline:  Goal status: INITIAL  3.  Muscle spasms to have improved by at least 50% in order to assist in reducing pain/improving function  Baseline:  Goal status: INITIAL  4.  Modified Oswestry to be no more than 5/50 to show improved function and pain  Baseline:  Goal status: INITIAL   PLAN:  PT FREQUENCY:  Eval + 3 treatments (pt unable to commit to consistent schedule)  PT DURATION: 8 weeks  PLANNED INTERVENTIONS: Therapeutic exercises, Therapeutic activity, Neuromuscular re-education, Balance training, Gait training, Patient/Family education, Self Care, Joint mobilization, Dry Needling, Electrical stimulation, Spinal mobilization, Moist heat, Taping, Ultrasound, Ionotophoresis 4mg /ml Dexamethasone, Manual therapy, and  Re-evaluation.  PLAN FOR NEXT SESSION: core strengthening, manual PRN, lumbar mobility and stretches for paraspinal spasms, DN    Rebekah Martinez U PT DPT PN2  03/29/2022, 1:39 PM

## 2022-04-12 ENCOUNTER — Ambulatory Visit: Payer: Medicaid Other | Admitting: Physical Therapy

## 2022-04-19 ENCOUNTER — Encounter: Payer: Self-pay | Admitting: Nurse Practitioner

## 2022-04-19 ENCOUNTER — Ambulatory Visit (INDEPENDENT_AMBULATORY_CARE_PROVIDER_SITE_OTHER): Payer: Medicaid Other | Admitting: Nurse Practitioner

## 2022-04-19 VITALS — BP 105/71 | HR 73 | Wt 124.2 lb

## 2022-04-19 DIAGNOSIS — G8929 Other chronic pain: Secondary | ICD-10-CM | POA: Diagnosis not present

## 2022-04-19 DIAGNOSIS — M545 Low back pain, unspecified: Secondary | ICD-10-CM

## 2022-04-19 MED ORDER — MELOXICAM 7.5 MG PO TABS: 7.5000 mg | ORAL_TABLET | Freq: Every day | ORAL | 0 refills | Status: AC

## 2022-04-19 NOTE — Progress Notes (Signed)
 @  Patient ID: Rebekah Martinez, female    DOB: February 13, 1986, 36 y.o.   MRN: [id number]  Chief Complaint  Patient presents with   Back Pain    Referring provider: No ref. provider found   HPI  Patient presents for a follow-up today.  Patient was seen about a month ago for back pain.  She was prescribed prednisone.  She states that this did help but the pain returned after she finished the medication.  Patient reports that pain is located in her low back bilaterally with sciatica.  This pain has been ongoing since last July. She has had 1 visit with physical therapy.  We will refer her to orthopedics.  We will trial Mobic. Denies f/c/s, n/v/d, hemoptysis, PND, leg swelling Denies chest pain or edema     No Known Allergies  Immunization History  Administered Date(s) Administered   Influenza-Unspecified 10/27/2018   Tdap 12/28/2018    Past Medical History:  Diagnosis Date   Medical history non-contributory     Tobacco History: Social History   Tobacco Use  Smoking Status Never  Smokeless Tobacco Never   Counseling given: Not Answered   Outpatient Encounter Medications as of 04/19/2022  Medication Sig   meloxicam (MOBIC) 7.5 MG tablet Take 1 tablet (7.5 mg total) by mouth daily.   No facility-administered encounter medications on file as of 04/19/2022.     Review of Systems  Review of Systems  Constitutional: Negative.   HENT: Negative.    Cardiovascular: Negative.   Gastrointestinal: Negative.   Musculoskeletal:  Positive for back pain.  Allergic/Immunologic: Negative.   Neurological: Negative.   Psychiatric/Behavioral: Negative.         Physical Exam  BP 105/71   Pulse 73   Wt 124 lb 3.2 oz (56.3 kg)   SpO2 98%   BMI 25.09 kg/m   Wt Readings from Last 5 Encounters:  04/19/22 124 lb 3.2 oz (56.3 kg)  03/22/22 123 lb (55.8 kg)  07/23/19 119 lb (54 kg)  04/16/19 114 lb 8 oz (51.9 kg)  03/27/19 138 lb (62.6 kg)     Physical Exam Vitals and nursing  note reviewed.  Constitutional:      General: She is not in acute distress.    Appearance: She is well-developed.  Cardiovascular:     Rate and Rhythm: Normal rate and regular rhythm.  Pulmonary:     Effort: Pulmonary effort is normal.     Breath sounds: Normal breath sounds.  Musculoskeletal:     Lumbar back: Tenderness present. Decreased range of motion.  Neurological:     Mental Status: She is alert and oriented to person, place, and time.       Assessment & Plan:   Chronic bilateral low back pain without sciatica - Ambulatory referral to Physical Therapy - Ambulatory referral to Orthopedic Surgery - meloxicam (MOBIC) 7.5 MG tablet; Take 1 tablet (7.5 mg total) by mouth daily.  Dispense: 30 tablet; Refill: 0  Follow up:  Follow up in 3 months or sooner     Ivonne Andrew, NP 04/19/2022

## 2022-04-19 NOTE — Patient Instructions (Addendum)
1. Chronic bilateral low back pain without sciatica  - Ambulatory referral to Physical Therapy - Ambulatory referral to Orthopedic Surgery - meloxicam (MOBIC) 7.5 MG tablet; Take 1 tablet (7.5 mg total) by mouth daily.  Dispense: 30 tablet; Refill: 0  Follow up:  Follow up in 3 months or sooner

## 2022-04-19 NOTE — Assessment & Plan Note (Signed)
-   Ambulatory referral to Physical Therapy - Ambulatory referral to Orthopedic Surgery - meloxicam (MOBIC) 7.5 MG tablet; Take 1 tablet (7.5 mg total) by mouth daily.  Dispense: 30 tablet; Refill: 0  Follow up:  Follow up in 3 months or sooner

## 2022-04-26 ENCOUNTER — Ambulatory Visit (INDEPENDENT_AMBULATORY_CARE_PROVIDER_SITE_OTHER): Payer: Medicaid Other | Admitting: Physician Assistant

## 2022-04-26 ENCOUNTER — Encounter: Payer: Self-pay | Admitting: Physician Assistant

## 2022-04-26 DIAGNOSIS — M545 Low back pain, unspecified: Secondary | ICD-10-CM

## 2022-04-26 DIAGNOSIS — G8929 Other chronic pain: Secondary | ICD-10-CM

## 2022-04-26 NOTE — Progress Notes (Signed)
Office Visit Note   Patient: Rebekah Martinez           Date of Birth: March 12, 1986           MRN: [id number] Visit Date: 04/26/2022              Requested by: Ivonne Andrew, NP [phone] [address] [place],  [place] [address] PCP: Ivonne Andrew, NP  Chief Complaint  Patient presents with   Lower Back - Pain   Left Hip - Pain   Right Hip - Pain      HPI: Patient is a pleasant 36 year old woman with a 62-month history of low back pain and lateral hip pain bilaterally.  Her husband is interpreting for her today.  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  She denies any paresthesias any weakness any radicular findings.  She denies any loss of bowel or bladder control.  She denies any family history of systemic arthropathy.  She was recently prescribed physical therapy but has only been to 1 session because they have been some communication difficulties in scheduling further sessions.  She has also been prescribed recently 7-1/2 mg of Mobic which has decreased her symptoms.  Denies any groin pain.  Denies any fever or chills.  Assessment & Plan: Visit Diagnoses:  1. Chronic bilateral low back pain without sciatica     Plan: Her exam and films are reassuring.  She does not have any radicular findings.  This seems to be more muscular.  Wonder if her job where she sits for long periods of time contributes to this.  She has gotten some relief with small dose of Mobic.  I think she should continue this.  I do think physical therapy is very important and I have asked that if they cannot arrange physical therapy to contact me and I will intervene.  She is willing to try this.  She will follow-up in 6 weeks.  If she was no better could consider an MRI  Follow-Up Instructions: No follow-ups on file.   Ortho Exam  Patient is alert, oriented, no adenopathy, well-dressed, normal affect, normal respiratory effort. Patient appears  alert comfortable.  She does have some general soreness with forward bending.  None with back extension or side-to-side bending.  Mildly tender over the trochanteric bursa but not the source of her pain.  No tenderness in the groin.  No tenderness with manipulation of either of her hip.  Her strength is 5 out of 5 with resisted dorsiflexion plantarflexion extension flexion of her head of her legs.  Imaging: No results found. No images are attached to the encounter.  Labs: No results found for: "HGBA1C", "ESRSEDRATE", "CRP", "LABURIC", "REPTSTATUS", "GRAMSTAIN", "CULT", "LABORGA"   Lab Results  Component Value Date   ALBUMIN 4.8 03/22/2022    No results found for: "MG" No results found for: "VD25OH"  No results found for: "PREALBUMIN"    Latest Ref Rng & Units 03/22/2022    2:18 PM 03/29/2019    4:34 AM 03/27/2019    8:32 AM  CBC EXTENDED  WBC 3.4 - 10.8 x10E3/uL 6.4  23.2  8.8   RBC 3.77 - 5.28 x10E6/uL 4.54  3.61  4.41   Hemoglobin 11.1 - 15.9 g/dL 93.8  10.1  75.1   HCT 34.0 - 46.6 % 41.0  33.8  40.6   Platelets 150 - 450 x10E3/uL 306  224  313      There is no height or weight on file to calculate BMI.  Orders:  No orders of the defined types were placed in this encounter.  No orders of the defined types were placed in this encounter.    Procedures: No procedures performed  Clinical Data: No additional findings.  ROS:  All other systems negative, except as noted in the HPI. Review of Systems  Objective: Vital Signs: There were no vitals taken for this visit.  Specialty Comments:  No specialty comments available.  PMFS History: Patient Active Problem List   Diagnosis Date Noted   Chronic bilateral low back pain without sciatica 03/22/2022   Nexplanon in place 07/23/2019   Arrest of descent, delivered, current hospitalization 03/28/2019   Lab test positive for detection of COVID-19 virus 03/28/2019   Language barrier 03/28/2019   Post term pregnancy  at [redacted] weeks gestation 03/27/2019   Past Medical History:  Diagnosis Date   Medical history non-contributory     Family History  Problem Relation Age of Onset   Hypertension Mother     Past Surgical History:  Procedure Laterality Date   CESAREAN SECTION N/A 03/28/2019   Procedure: CESAREAN SECTION;  Surgeon: Adam Phenix, MD;  Location: [facility];  Service: Obstetrics;  Laterality: N/A;   NO PAST SURGERIES     Social History   Occupational History   Not on file  Tobacco Use   Smoking status: Never   Smokeless tobacco: Never  Vaping Use   Vaping Use: Never used  Substance and Sexual Activity   Alcohol use: Never   Drug use: Never   Sexual activity: Not on file

## 2022-05-18 ENCOUNTER — Other Ambulatory Visit: Payer: Self-pay | Admitting: Nurse Practitioner

## 2022-05-18 DIAGNOSIS — G8929 Other chronic pain: Secondary | ICD-10-CM

## 2022-06-07 ENCOUNTER — Ambulatory Visit (INDEPENDENT_AMBULATORY_CARE_PROVIDER_SITE_OTHER): Payer: Medicaid Other | Admitting: Physician Assistant

## 2022-06-07 DIAGNOSIS — G8929 Other chronic pain: Secondary | ICD-10-CM | POA: Diagnosis not present

## 2022-06-07 DIAGNOSIS — M545 Low back pain, unspecified: Secondary | ICD-10-CM

## 2022-06-07 NOTE — Progress Notes (Signed)
Office Visit Note   Patient: Rebekah Martinez           Date of Birth: 1986/01/20           MRN: [id number] Visit Date: 06/07/2022              Requested by: Fenton Foy, NP [phone] [address] [place],  [place] [address] PCP: Fenton Foy, NP  Chief Complaint  Patient presents with   Lower Back - Follow-up      HPI: Patient returns today for follow-up on her low back pain.  She is here with the help of an interpreter.  She admits she is doing much better.  She still has some pain in her lower back.  She did not go to physical therapy.  She is requesting an MRI since she still has a little bit of pain  Assessment & Plan: Visit Diagnoses:  1. Chronic bilateral low back pain without sciatica     Plan: I encouraged her to try physical therapy just to resolve her symptoms and she can get off the medication but she does not want to do this.  She is willing to do an exercise program I also suggested trying a yoga class.  I will go forward with the MRI  Follow-Up Instructions: After MRI  Ortho Exam  Patient is alert, oriented, no adenopathy, well-dressed, normal affect, normal respiratory effort. Examination she is sitting comfortably with her legs crossed on the exam table.  She has 5 out of 5 strength with resisted dorsiflexion plantarflexion extension and flexion of her legs.  She negative straight leg raise bilaterally just mild muscle tenderness over her lower back.  She is neurovascularly intact  Imaging: No results found. No images are attached to the encounter.  Labs: No results found for: "HGBA1C", "ESRSEDRATE", "CRP", "LABURIC", "REPTSTATUS", "GRAMSTAIN", "CULT", "LABORGA"   Lab Results  Component Value Date   ALBUMIN 4.8 03/22/2022    No results found for: "MG" No results found for: "VD25OH"  No results found for: "PREALBUMIN"    Latest Ref Rng & Units 03/22/2022    2:18 PM 03/29/2019    4:34 AM 03/27/2019    8:32 AM  CBC EXTENDED  WBC 3.4 - 10.8  x10E3/uL 6.4  23.2  8.8   RBC 3.77 - 5.28 x10E6/uL 4.54  3.61  4.41   Hemoglobin 11.1 - 15.9 g/dL 13.8  11.1  13.8   HCT 34.0 - 46.6 % 41.0  33.8  40.6   Platelets 150 - 450 x10E3/uL 306  224  313      There is no height or weight on file to calculate BMI.  Orders:  No orders of the defined types were placed in this encounter.  No orders of the defined types were placed in this encounter.    Procedures: No procedures performed  Clinical Data: No additional findings.  ROS:  All other systems negative, except as noted in the HPI. Review of Systems  Objective: Vital Signs: There were no vitals taken for this visit.  Specialty Comments:  No specialty comments available.  PMFS History: Patient Active Problem List   Diagnosis Date Noted   Chronic bilateral low back pain without sciatica 03/22/2022   Nexplanon in place 07/23/2019   Arrest of descent, delivered, current hospitalization 03/28/2019   Lab test positive for detection of COVID-19 virus 03/28/2019   Language barrier 03/28/2019   Post term pregnancy at [redacted] weeks gestation 03/27/2019   Past Medical  History:  Diagnosis Date   Medical history non-contributory     Family History  Problem Relation Age of Onset   Hypertension Mother     Past Surgical History:  Procedure Laterality Date   CESAREAN SECTION N/A 03/28/2019   Procedure: CESAREAN SECTION;  Surgeon: Woodroe Mode, MD;  Location: [facility];  Service: Obstetrics;  Laterality: N/A;   NO PAST SURGERIES     Social History   Occupational History   Not on file  Tobacco Use   Smoking status: Never   Smokeless tobacco: Never  Vaping Use   Vaping Use: Never used  Substance and Sexual Activity   Alcohol use: Never   Drug use: Never   Sexual activity: Not on file

## 2022-06-18 DIAGNOSIS — Z01419 Encounter for gynecological examination (general) (routine) without abnormal findings: Secondary | ICD-10-CM | POA: Diagnosis not present

## 2022-06-18 DIAGNOSIS — Z114 Encounter for screening for human immunodeficiency virus [HIV]: Secondary | ICD-10-CM | POA: Diagnosis not present

## 2022-06-18 DIAGNOSIS — Z3046 Encounter for surveillance of implantable subdermal contraceptive: Secondary | ICD-10-CM | POA: Diagnosis not present

## 2022-06-18 DIAGNOSIS — Z789 Other specified health status: Secondary | ICD-10-CM | POA: Diagnosis not present

## 2022-07-05 DIAGNOSIS — Z30017 Encounter for initial prescription of implantable subdermal contraceptive: Secondary | ICD-10-CM | POA: Diagnosis not present

## 2022-07-05 DIAGNOSIS — Z3009 Encounter for other general counseling and advice on contraception: Secondary | ICD-10-CM | POA: Diagnosis not present

## 2022-07-05 DIAGNOSIS — Z3046 Encounter for surveillance of implantable subdermal contraceptive: Secondary | ICD-10-CM | POA: Diagnosis not present

## 2022-07-05 DIAGNOSIS — Z789 Other specified health status: Secondary | ICD-10-CM | POA: Diagnosis not present

## 2022-07-19 ENCOUNTER — Ambulatory Visit (INDEPENDENT_AMBULATORY_CARE_PROVIDER_SITE_OTHER): Payer: Medicaid Other | Admitting: Nurse Practitioner

## 2022-07-19 VITALS — BP 103/56 | HR 71 | Wt 124.0 lb

## 2022-07-19 DIAGNOSIS — G8929 Other chronic pain: Secondary | ICD-10-CM | POA: Diagnosis not present

## 2022-07-19 DIAGNOSIS — M545 Low back pain, unspecified: Secondary | ICD-10-CM

## 2022-07-19 DIAGNOSIS — L659 Nonscarring hair loss, unspecified: Secondary | ICD-10-CM | POA: Diagnosis not present

## 2022-07-19 NOTE — Progress Notes (Unsigned)
 @  Patient ID: Rebekah Martinez, female    DOB: 09-14-1985, 37 y.o.   MRN: [id number]  No chief complaint on file.   Referring provider: Fenton Foy, NP  HPI   Back pain improving  Has seen ortho - MRI has been ordered  PT recommended, patient would be willing to do this  Hair loss - will check thyroid - will refer to dermatology if thyroid normal   No Known Allergies  Immunization History  Administered Date(s) Administered   Influenza-Unspecified 10/27/2018   Tdap 12/28/2018    Past Medical History:  Diagnosis Date   Medical history non-contributory     Tobacco History: Social History   Tobacco Use  Smoking Status Never  Smokeless Tobacco Never   Counseling given: Not Answered   Outpatient Encounter Medications as of 07/19/2022  Medication Sig   meloxicam (MOBIC) 7.5 MG tablet Take 1 tablet (7.5 mg total) by mouth daily.   No facility-administered encounter medications on file as of 07/19/2022.     Review of Systems  Review of Systems  Constitutional: Negative.   HENT: Negative.    Cardiovascular: Negative.   Gastrointestinal: Negative.   Musculoskeletal:  Positive for back pain.  Allergic/Immunologic: Negative.   Neurological: Negative.   Psychiatric/Behavioral: Negative.         Physical Exam  There were no vitals taken for this visit.  Wt Readings from Last 5 Encounters:  04/19/22 124 lb 3.2 oz (56.3 kg)  03/22/22 123 lb (55.8 kg)  07/23/19 119 lb (54 kg)  04/16/19 114 lb 8 oz (51.9 kg)  03/27/19 138 lb (62.6 kg)     Physical Exam Vitals and nursing note reviewed.  Constitutional:      General: She is not in acute distress.    Appearance: She is well-developed.  Cardiovascular:     Rate and Rhythm: Normal rate and regular rhythm.  Pulmonary:     Effort: Pulmonary effort is normal.     Breath sounds: Normal breath sounds.  Neurological:     Mental Status: She is alert and oriented to person, place, and time.      Lab  Results:  CBC    Component Value Date/Time   WBC 6.4 03/22/2022 1418   WBC 23.2 (H) 03/29/2019 0434   RBC 4.54 03/22/2022 1418   RBC 3.61 (L) 03/29/2019 0434   HGB 13.8 03/22/2022 1418   HCT 41.0 03/22/2022 1418   PLT 306 03/22/2022 1418   MCV 90 03/22/2022 1418   MCH 30.4 03/22/2022 1418   MCH 30.7 03/29/2019 0434   MCHC 33.7 03/22/2022 1418   MCHC 32.8 03/29/2019 0434   RDW 12.2 03/22/2022 1418    BMET    Component Value Date/Time   NA 140 03/22/2022 1418   K 3.7 03/22/2022 1418   CL 103 03/22/2022 1418   CO2 24 03/22/2022 1418   GLUCOSE 91 03/22/2022 1418   BUN 12 03/22/2022 1418   CREATININE 0.77 03/22/2022 1418   CALCIUM 9.9 03/22/2022 1418   GFRNONAA >60 03/29/2019 0434   GFRAA >60 03/29/2019 0434    BNP No results found for: "BNP"  ProBNP No results found for: "PROBNP"  Imaging: No results found.   Assessment & Plan:   No problem-specific Assessment & Plan notes found for this encounter.     Fenton Foy, NP 07/19/2022

## 2022-07-19 NOTE — Patient Instructions (Addendum)
1. Chronic bilateral low back pain without sciatica  Continue yoga  Will try PT  Continue to follow with PT  Follow up:  Follow up in 6 months    Low Back Sprain or Strain Rehab Ask your health care provider which exercises are safe for you. Do exercises exactly as told by your health care provider and adjust them as directed. It is normal to feel mild stretching, pulling, tightness, or discomfort as you do these exercises. Stop right away if you feel sudden pain or your pain gets worse. Do not begin these exercises until told by your health care provider. Stretching and range-of-motion exercises These exercises warm up your muscles and joints and improve the movement and flexibility of your back. These exercises also help to relieve pain, numbness, and tingling. Lumbar rotation  Lie on your back on a firm bed or the floor with your knees bent. Straighten your arms out to your sides so each arm forms a 90-degree angle (right angle) with a side of your body. Slowly move (rotate) both of your knees to one side of your body until you feel a stretch in your lower back (lumbar). Try not to let your shoulders lift off the floor. Hold this position for __________ seconds. Tense your abdominal muscles and slowly move your knees back to the starting position. Repeat this exercise on the other side of your body. Repeat __________ times. Complete this exercise __________ times a day. Single knee to chest  Lie on your back on a firm bed or the floor with both legs straight. Bend one of your knees. Use your hands to move your knee up toward your chest until you feel a gentle stretch in your lower back and buttock. Hold your leg in this position by holding on to the front of your knee. Keep your other leg as straight as possible. Hold this position for __________ seconds. Slowly return to the starting position. Repeat with your other leg. Repeat __________ times. Complete this exercise  __________ times a day. Prone extension on elbows  Lie on your abdomen on a firm bed or the floor (prone position). Prop yourself up on your elbows. Use your arms to help lift your chest up until you feel a gentle stretch in your abdomen and your lower back. This will place some of your body weight on your elbows. If this is uncomfortable, try stacking pillows under your chest. Your hips should stay down, against the surface that you are lying on. Keep your hip and back muscles relaxed. Hold this position for __________ seconds. Slowly relax your upper body and return to the starting position. Repeat __________ times. Complete this exercise __________ times a day. Strengthening exercises These exercises build strength and endurance in your back. Endurance is the ability to use your muscles for a long time, even after they get tired. Pelvic tilt This exercise strengthens the muscles that lie deep in the abdomen. Lie on your back on a firm bed or the floor with your legs extended. Bend your knees so they are pointing toward the ceiling and your feet are flat on the floor. Tighten your lower abdominal muscles to press your lower back against the floor. This motion will tilt your pelvis so your tailbone points up toward the ceiling instead of pointing to your feet or the floor. To help with this exercise, you may place a small towel under your lower back and try to push your back into the towel. Hold this position  for __________ seconds. Let your muscles relax completely before you repeat this exercise. Repeat __________ times. Complete this exercise __________ times a day. Alternating arm and leg raises  Get on your hands and knees on a firm surface. If you are on a hard floor, you may want to use padding, such as an exercise mat, to cushion your knees. Line up your arms and legs. Your hands should be directly below your shoulders, and your knees should be directly below your hips. Lift your  left leg behind you. At the same time, raise your right arm and straighten it in front of you. Do not lift your leg higher than your hip. Do not lift your arm higher than your shoulder. Keep your abdominal and back muscles tight. Keep your hips facing the ground. Do not arch your back. Keep your balance carefully, and do not hold your breath. Hold this position for __________ seconds. Slowly return to the starting position. Repeat with your right leg and your left arm. Repeat __________ times. Complete this exercise __________ times a day. Abdominal set with straight leg raise  Lie on your back on a firm bed or the floor. Bend one of your knees and keep your other leg straight. Tense your abdominal muscles and lift your straight leg up, 4-6 inches (10-15 cm) off the ground. Keep your abdominal muscles tight and hold this position for __________ seconds. Do not hold your breath. Do not arch your back. Keep it flat against the ground. Keep your abdominal muscles tense as you slowly lower your leg back to the starting position. Repeat with your other leg. Repeat __________ times. Complete this exercise __________ times a day. Single leg lower with bent knees Lie on your back on a firm bed or the floor. Tense your abdominal muscles and lift your feet off the floor, one foot at a time, so your knees and hips are bent in 90-degree angles (right angles). Your knees should be over your hips and your lower legs should be parallel to the floor. Keeping your abdominal muscles tense and your knee bent, slowly lower one of your legs so your toe touches the ground. Lift your leg back up to return to the starting position. Do not hold your breath. Do not let your back arch. Keep your back flat against the ground. Repeat with your other leg. Repeat __________ times. Complete this exercise __________ times a day. Posture and body mechanics Good posture and healthy body mechanics can help to relieve  stress in your body's tissues and joints. Body mechanics refers to the movements and positions of your body while you do your daily activities. Posture is part of body mechanics. Good posture means: Your spine is in its natural S-curve position (neutral). Your shoulders are pulled back slightly. Your head is not tipped forward (neutral). Follow these guidelines to improve your posture and body mechanics in your everyday activities. Standing  When standing, keep your spine neutral and your feet about hip-width apart. Keep a slight bend in your knees. Your ears, shoulders, and hips should line up. When you do a task in which you stand in one place for a long time, place one foot up on a stable object that is 2-4 inches (5-10 cm) high, such as a footstool. This helps keep your spine neutral. Sitting  When sitting, keep your spine neutral and keep your feet flat on the floor. Use a footrest, if necessary, and keep your thighs parallel to the floor. Avoid rounding  your shoulders, and avoid tilting your head forward. When working at a desk or a computer, keep your desk at a height where your hands are slightly lower than your elbows. Slide your chair under your desk so you are close enough to maintain good posture. When working at a computer, place your monitor at a height where you are looking straight ahead and you do not have to tilt your head forward or downward to look at the screen. Resting When lying down and resting, avoid positions that are most painful for you. If you have pain with activities such as sitting, bending, stooping, or squatting, lie in a position in which your body does not bend very much. For example, avoid curling up on your side with your arms and knees near your chest (fetal position). If you have pain with activities such as standing for a long time or reaching with your arms, lie with your spine in a neutral position and bend your knees slightly. Try the following  positions: Lying on your side with a pillow between your knees. Lying on your back with a pillow under your knees. Lifting  When lifting objects, keep your feet at least shoulder-width apart and tighten your abdominal muscles. Bend your knees and hips and keep your spine neutral. It is important to lift using the strength of your legs, not your back. Do not lock your knees straight out. Always ask for help to lift heavy or awkward objects. This information is not intended to replace advice given to you by your health care provider. Make sure you discuss any questions you have with your health care provider. Document Revised: 07/17/2020 Document Reviewed: 07/17/2020 Elsevier Patient Education  [place].

## 2022-07-20 LAB — THYROID PANEL WITH TSH
Free Thyroxine Index: 2.8 (ref 1.2–4.9)
T3 Uptake Ratio: 26 % (ref 24–39)
T4, Total: 10.9 ug/dL (ref 4.5–12.0)
TSH: 0.938 u[IU]/mL (ref 0.450–4.500)

## 2022-07-22 NOTE — Progress Notes (Signed)
Lab results mail to pt home address. [place]

## 2022-07-24 NOTE — Assessment & Plan Note (Signed)
Continue yoga  Will try PT  Continue to follow with PT  Follow up:  Follow up in 6 months

## 2022-08-06 ENCOUNTER — Encounter (HOSPITAL_COMMUNITY): Payer: Self-pay

## 2022-08-06 ENCOUNTER — Ambulatory Visit (HOSPITAL_COMMUNITY)
Admission: EM | Admit: 2022-08-06 | Discharge: 2022-08-06 | Disposition: A | Discharge status: Home / Self Care | Payer: Medicaid Other | Attending: Internal Medicine | Admitting: Internal Medicine

## 2022-08-06 DIAGNOSIS — A084 Viral intestinal infection, unspecified: Secondary | ICD-10-CM | POA: Diagnosis not present

## 2022-08-06 MED ORDER — ONDANSETRON 4 MG PO TBDP: 4.0000 mg | ORAL_TABLET | Freq: Three times a day (TID) | ORAL | 0 refills | Status: AC | PRN

## 2022-08-06 MED ORDER — ONDANSETRON 4 MG PO TBDP
ORAL_TABLET | ORAL | Status: AC
  Filled 2022-08-06: qty 1

## 2022-08-06 MED ORDER — ONDANSETRON 4 MG PO TBDP
4.0000 mg | ORAL_TABLET | Freq: Once | ORAL | Status: AC
  Administered 2022-08-06: 4 mg via ORAL

## 2022-08-06 NOTE — ED Triage Notes (Signed)
Pt states vomiting since this am.  States her 35 year old son was also vomiting and her husband took him to the ED this am.

## 2022-08-06 NOTE — Discharge Instructions (Signed)

## 2022-08-06 NOTE — ED Provider Notes (Signed)
[place]    CSN: [id number] Arrival date & time: 08/06/22  1501      History   Chief Complaint Chief Complaint  Patient presents with   Emesis    HPI Rebekah Martinez is a 37 y.o. female.   Patient presents to care for evaluation of nausea, vomiting, and generalized fatigue/weakness that started abruptly today.  Her 44-year-old son is sick with similar symptoms and they believe that she may have gotten sick from him.  She is currently nauseous but has not had any episodes of emesis in the last 1 hour.  She has been able to sip on clear liquids since last episode of emesis.  Emesis is nonbloody/nonbilious.  Denies chance of pregnancy, has Nexplanon implant.  Last withdrawal bleed was July 17, 2022.  Denies abdominal pain, diarrhea, rash, sore throat, viral URI symptoms, fever/chills, dizziness, heart palpitations, cough, shortness of breath, and urinary symptoms.  No flank pain, low back pain, body aches, or vaginal symptoms reported.  No fever at home.  She has not attempted use of any over-the-counter medications before coming to urgent care.   Emesis   Past Medical History:  Diagnosis Date   Medical history non-contributory     Patient Active Problem List   Diagnosis Date Noted   Chronic bilateral low back pain without sciatica 03/22/2022   Nexplanon in place 07/23/2019   Arrest of descent, delivered, current hospitalization 03/28/2019   Lab test positive for detection of COVID-19 virus 03/28/2019   Language barrier 03/28/2019   Post term pregnancy at [redacted] weeks gestation 03/27/2019    Past Surgical History:  Procedure Laterality Date   CESAREAN SECTION N/A 03/28/2019   Procedure: CESAREAN SECTION;  Surgeon: Woodroe Mode, MD;  Location: [facility];  Service: Obstetrics;  Laterality: N/A;   NO PAST SURGERIES      OB History     Gravida  1   Para  1   Term  1   Preterm      AB      Living  1      SAB      IAB      Ectopic      Multiple  0    Live Births  1            Home Medications    Prior to Admission medications   Medication Sig Start Date End Date Taking? Authorizing Provider  ondansetron (ZOFRAN-ODT) 4 MG disintegrating tablet Take 1 tablet (4 mg total) by mouth every 8 (eight) hours as needed for nausea or vomiting. 08/06/22  Yes Talbot Grumbling, FNP  meloxicam (MOBIC) 7.5 MG tablet Take 1 tablet (7.5 mg total) by mouth daily. 04/19/22   Fenton Foy, NP    Family History Family History  Problem Relation Age of Onset   Hypertension Mother     Social History Social History   Tobacco Use   Smoking status: Never   Smokeless tobacco: Never  Vaping Use   Vaping Use: Never used  Substance Use Topics   Alcohol use: Never   Drug use: Never     Allergies   Patient has no known allergies.   Review of Systems Review of Systems  Gastrointestinal:  Positive for vomiting.  Per HPI   Physical Exam Triage Vital Signs ED Triage Vitals  Enc Vitals Group     BP 08/06/22 1549 104/68     Pulse Rate 08/06/22 1549 90     Resp  08/06/22 1549 16     Temp 08/06/22 1549 98.4 F (36.9 C)     Temp Source 08/06/22 1549 Oral     SpO2 08/06/22 1549 98 %     Weight --      Height --      Head Circumference --      Peak Flow --      Pain Score 08/06/22 1551 0     Pain Loc --      Pain Edu? --      Excl. in [place]? --    No data found.  Updated Vital Signs BP 104/68 (BP Location: Left Arm)   Pulse 90   Temp 98.4 F (36.9 C) (Oral)   Resp 16   LMP 06/18/2022   SpO2 98%   Visual Acuity Right Eye Distance:   Left Eye Distance:   Bilateral Distance:    Right Eye Near:   Left Eye Near:    Bilateral Near:     Physical Exam Vitals and nursing note reviewed.  Constitutional:      Appearance: She is ill-appearing. She is not toxic-appearing.  HENT:     Head: Normocephalic and atraumatic.     Right Ear: Hearing and external ear normal.     Left Ear: Hearing and external ear normal.     Nose:  Nose normal.     Mouth/Throat:     Lips: Pink.     Mouth: Mucous membranes are moist. No injury.     Tongue: No lesions. Tongue does not deviate from midline.     Palate: No mass and lesions.     Pharynx: Oropharynx is clear. Uvula midline. No pharyngeal swelling, oropharyngeal exudate, posterior oropharyngeal erythema or uvula swelling.     Tonsils: No tonsillar exudate or tonsillar abscesses.  Eyes:     General: Lids are normal. Vision grossly intact. Gaze aligned appropriately.     Extraocular Movements: Extraocular movements intact.     Conjunctiva/sclera: Conjunctivae normal.  Cardiovascular:     Rate and Rhythm: Normal rate and regular rhythm.     Heart sounds: Normal heart sounds, S1 normal and S2 normal.  Pulmonary:     Effort: Pulmonary effort is normal. No respiratory distress.     Breath sounds: Normal breath sounds and air entry.  Abdominal:     General: Bowel sounds are normal.     Palpations: Abdomen is soft.     Tenderness: There is no abdominal tenderness. There is no right CVA tenderness, left CVA tenderness or guarding.  Musculoskeletal:     Cervical back: Neck supple.  Skin:    General: Skin is warm and dry.     Capillary Refill: Capillary refill takes less than 2 seconds.     Findings: No rash.  Neurological:     General: No focal deficit present.     Mental Status: She is alert and oriented to person, place, and time. Mental status is at baseline.     Cranial Nerves: No dysarthria or facial asymmetry.  Psychiatric:        Mood and Affect: Mood normal.        Speech: Speech normal.        Behavior: Behavior normal.        Thought Content: Thought content normal.        Judgment: Judgment normal.      UC Treatments / Results  Labs (all labs ordered are listed, but only abnormal results are displayed) Labs Reviewed -  No data to display  EKG   Radiology No results found.  Procedures Procedures (including critical care time)  Medications Ordered  in UC Medications  ondansetron (ZOFRAN-ODT) disintegrating tablet 4 mg (4 mg Oral Given 08/06/22 1634)    Initial Impression / Assessment and Plan / UC Course  I have reviewed the triage vital signs and the nursing notes.  Pertinent labs & imaging results that were available during my care of the patient were reviewed by me and considered in my medical decision making (see chart for details).   1. Viral gastroenteritis Presentation is consistent with acute viral gastroenteritis that will likely improve with rest, increased fluid intake, and as needed use of antiemetics. Abdominal exam is without peritoneal signs, patient is nontoxic in appearance, and vitals are hemodynamically stable. Push fluids with water and pedialyte for rehydration. Zofran 4mg  ODT given in clinic for nausea. May use zofran 4mg  ODT every 8 hours as needed for nausea and vomiting. May use tylenol for abdominal discomfort at home related to viral illness. Bland/liquid diet recommended for 12-24 hours, then increase diet to normal as tolerated.    Discussed physical exam and available lab work findings in clinic with patient.  Counseled patient regarding appropriate use of medications and potential side effects for all medications recommended or prescribed today. Discussed red flag signs and symptoms of worsening condition,when to call the PCP office, return to urgent care, and when to seek higher level of care in the emergency department. Patient verbalizes understanding and agreement with plan. All questions answered. Patient discharged in stable condition.    Final Clinical Impressions(s) / UC Diagnoses   Final diagnoses:  Viral gastroenteritis     Discharge Instructions      Your evaluation suggests that your symptoms are most likely due to viral stomach illness (gastroenteritis) which will improve on its own with rest and fluids in the next few days.   I have prescribed an antinausea medication for you to take at  home called Zofran. It is the same medication that we gave you in the office.  You may use tylenol 1,000mg  every 6 hours over the counter as needed for abdominal discomfort related to this virus.   Eat a bland diet for the next 12-24 hours (bananas, rice, white toast, and applesauce) once you are able to tolerate liquids (broth, etc). These foods are easy for your stomach to digest. Pedialyte can be purchased to help with rehydration. Drink plenty of water.   Please follow up with your primary care provider for further management. Return if you experience worsening or uncontrolled pain, inability to tolerate fluids by mouth, difficulty breathing, fevers 100.58F or greater, recurrent vomiting, or any other concerning symptoms. I hope you feel better!      ED Prescriptions     Medication Sig Dispense Auth. Provider   ondansetron (ZOFRAN-ODT) 4 MG disintegrating tablet Take 1 tablet (4 mg total) by mouth every 8 (eight) hours as needed for nausea or vomiting. 20 tablet Talbot Grumbling, FNP      PDMP not reviewed this encounter.   Talbot Grumbling, [place] 08/06/22 [phone]

## 2022-08-15 NOTE — Therapy (Addendum)
OUTPATIENT PHYSICAL THERAPY EVALUATION  DISCHARGE   Patient Name: Rebekah Martinez MRN: [id number] DOB:1985-08-30, 37 y.o., female Today's Date: 08/16/2022   END OF SESSION:  PT End of Session - 08/16/22 0712     Visit Number 1    Number of Visits 4    Date for PT Re-Evaluation 09/27/22    Authorization Type [employer] MCD    Authorization - Number of Visits 27    PT Start Time 0715    PT Stop Time 0800    PT Time Calculation (min) 45 min    Activity Tolerance Patient tolerated treatment well    Behavior During Therapy WFL for tasks assessed/performed             Past Medical History:  Diagnosis Date   Medical history non-contributory    Past Surgical History:  Procedure Laterality Date   CESAREAN SECTION N/A 03/28/2019   Procedure: CESAREAN SECTION;  Surgeon: Adam Phenix, MD;  Location: [facility];  Service: Obstetrics;  Laterality: N/A;   NO PAST SURGERIES     Patient Active Problem List   Diagnosis Date Noted   Chronic bilateral low back pain without sciatica 03/22/2022   Nexplanon in place 07/23/2019   Arrest of descent, delivered, current hospitalization 03/28/2019   Lab test positive for detection of COVID-19 virus 03/28/2019   Language barrier 03/28/2019   Post term pregnancy at [redacted] weeks gestation 03/27/2019    PCP: Ivonne Andrew, NP  REFERRING PROVIDER: Ivonne Andrew, NP  REFERRING DIAG: Chronic bilateral low back pain without sciatica  Rationale for Evaluation and Treatment: Rehabilitation  THERAPY DIAG:  Other low back pain  Muscle weakness (generalized)  ONSET DATE: Chronic for 1 year   SUBJECTIVE:                                                                                                                                                                                     SUBJECTIVE STATEMENT: Patient reports low back pain for almost a year with no mechanism of injury. She did have one session of PT before that helped a little. Sitting and  standing for long periods will cause her back to hurt more. She also has pain when trying to lift heavy objects. She sits for long periods at work and this hurts her back. When her back is hurting she tries to lie down and rest her back. She denies any pain in her legs. She had an MRI order but this has not been scheduled.  PERTINENT HISTORY:  None  PAIN:  Are you having pain? Yes:  NPRS scale: 0/10 (8/10 when pain is present) Pain location: Lower back  Pain description: "just hurts" Aggravating factors: Sitting or standing long periods, lifting Relieving factors: Rest  PRECAUTIONS: None  WEIGHT BEARING RESTRICTIONS: No  FALLS:  Has patient fallen in last 6 months? No  OCCUPATION: Factory setting, required to sit extended periods  PLOF: Independent  PATIENT GOALS: Pain relief and be able to sit for work tasks without limitation   OBJECTIVE:  PATIENT SURVEYS:  Not assessed due to language barrier  SCREENING FOR RED FLAGS: Negative  COGNITION: Overall cognitive status: Within functional limits for tasks assessed     SENSATION: WFL  MUSCLE LENGTH: Grossly WFL  POSTURE:   Rounded shoulder posture  PALPATION: Non-tender to palpation  LUMBAR ROM:   AROM eval  Flexion WFL  Extension WFL  Right lateral flexion WFL  Left lateral flexion WFL  Right rotation WFL  Left rotation WFL   (Blank rows = not tested)  Patient reports low back stretching with all movement  LOWER EXTREMITY ROM:    Grossly WFL  LOWER EXTREMITY MMT:    MMT Right eval Left eval  Hip flexion 4 4  Hip extension 4- 4-  Hip abduction 4- 4-  Hip adduction    Hip internal rotation    Hip external rotation    Knee flexion 5 5  Knee extension 5 5  Ankle dorsiflexion    Ankle plantarflexion    Ankle inversion    Ankle eversion     (Blank rows = not tested)  LUMBAR SPECIAL TESTS:  Radicular testing negative  FUNCTIONAL TESTS:  DLLT: loss of lumbar control at 45 deg and report of  low back pain with lowering Lifting: increased lumbar flexion  GAIT: Assistive device utilized: None Level of assistance: Complete Independence Comments: WFL   TODAY'S TREATMENT:    [facility] Adult PT Treatment:                                                DATE: 08/16/2022 Therapeutic Exercise: Bridge 5 x 5 sec 90-90 abdominal hold 5 x 5 sec Side clamshell with red x 5 each  PATIENT EDUCATION:  Education details: Exam findings, POC, HEP Person educated: Patient Education method: Explanation, Demonstration, Tactile cues, Verbal cues, and Handouts Education comprehension: verbalized understanding, returned demonstration, verbal cues required, tactile cues required, and needs further education  HOME EXERCISE PROGRAM: Access Code: [email or web address]    ASSESSMENT: CLINICAL IMPRESSION: Patient is a 37 y.o. female who was seen today for physical therapy evaluation and treatment for chronic lower back pain. She exhibits good lumbar and hip mobility, but does exhibit gross core and hip strength deficits and postural deviations likely contributing to a lowered tolerance for sitting, standing, and lifting, and impacting her ability to perform seated work related tasks.   OBJECTIVE IMPAIRMENTS: decreased activity tolerance, decreased strength, postural dysfunction, and pain.   ACTIVITY LIMITATIONS: lifting, sitting, and standing  PARTICIPATION LIMITATIONS: meal prep, cleaning, driving, and occupation  PERSONAL FACTORS: Fitness, Past/current experiences, and Time since onset of injury/illness/exacerbation are also affecting patient's functional outcome.   REHAB POTENTIAL: Good  CLINICAL DECISION MAKING: Stable/uncomplicated  EVALUATION COMPLEXITY: Low   GOALS: Goals reviewed with patient? Yes  SHORT TERM GOALS: Target date: 09/13/2022  Patient will be I with initial HEP in order to progress with therapy. Baseline: HEP provided at eval Goal status: INITIAL  2.  Patient will report low back  pain </=  5/10 with extended periods of sitting and standing in order to reduce functional limitations Baseline: 8/10 Goal status: INITIAL  3.  Patient will demonstrate proper lifting mechanics to reduce low back pain with lifting Baseline: Increased lumbar flexion Goal status: INITIAL  LONG TERM GOALS: Target date: 09/27/2022  Patient will be I with final HEP to maintain progress from PT. Baseline: HEP provided at eval Goal status: INITIAL  2.  Patient will report low back pain </= 2/10 with lifting, sitting, and standing in order to return to prior level of function Baseline: 8/10 at eval Goal status: INITIAL  3.  Patient will demonstrate DLLT </= 30 deg and without pain in order to indicate improvement in her core strength and low back control to reduce pain with standing and sitting Baseline: 45 deg Goal status: INITIAL  4.  Patient will demonstrate hip strength grossly >/= 4+/5 MMT in order to improve lifting ability Baseline: 4-/5 MMT Goal status: INITIAL   PLAN: PT FREQUENCY: 1x/week  PT DURATION: 6 weeks  PLANNED INTERVENTIONS: Therapeutic exercises, Therapeutic activity, Neuromuscular re-education, Balance training, Gait training, Patient/Family education, Self Care, Joint mobilization, Joint manipulation, Dry Needling, Spinal manipulation, Spinal mobilization, Manual therapy, and Re-evaluation.  PLAN FOR NEXT SESSION: Review HEP and progress PRN, focus primarily on core stabilization and hip strengthening, lifting mechanics and progression of weight with lifting, postural strengthening    Rosana Hoes, PT, DPT, LAT, ATC 08/16/22  8:32 AM Phone: [phone] Fax: [phone]    Check all possible CPT codes: 47425 - PT Re-evaluation, 97110- Therapeutic Exercise, [phone]- Neuro Re-education, [phone] - Gait Training, [phone] - Manual Therapy, 97530 - Therapeutic Activities, and 97535 - Self Care    Check all conditions that are expected to impact treatment: None of  these apply   If treatment provided at initial evaluation, no treatment charged due to lack of authorization.       PHYSICAL THERAPY DISCHARGE SUMMARY  Visits from Start of Care: 1  Current functional level related to goals / functional outcomes: See above   Remaining deficits: See above   Education / Equipment: HEP   Patient agrees to discharge. Patient goals were not met. Patient is being discharged due to not returning since the last visit.  Rosana Hoes, PT, DPT, LAT, ATC 11/07/22  1:46 PM Phone: [phone] Fax: [phone]

## 2022-08-16 ENCOUNTER — Ambulatory Visit: Payer: Medicaid Other | Attending: Nurse Practitioner | Admitting: Physical Therapy

## 2022-08-16 ENCOUNTER — Encounter: Payer: Self-pay | Admitting: Physical Therapy

## 2022-08-16 ENCOUNTER — Other Ambulatory Visit: Payer: Self-pay

## 2022-08-16 DIAGNOSIS — M6281 Muscle weakness (generalized): Secondary | ICD-10-CM

## 2022-08-16 DIAGNOSIS — M5459 Other low back pain: Secondary | ICD-10-CM

## 2022-08-16 NOTE — Patient Instructions (Signed)
Access Code: [email or web address] URL: [email or web address] Date: 08/16/2022 Prepared by: Rosana Hoes  Exercises - Bridge  - 1 x daily - 2 sets - 10 reps - 5 hold - Supine 90/90 Abdominal Bracing  - 1 x daily - 2 sets - 10 reps - 5 hold - Clam with Resistance  - 1 x daily - 2 sets - 10 reps

## 2022-08-30 ENCOUNTER — Encounter: Payer: Medicaid Other | Admitting: Physical Therapy

## 2022-09-13 ENCOUNTER — Encounter: Payer: Medicaid Other | Admitting: Physical Therapy

## 2022-09-13 ENCOUNTER — Encounter: Payer: Self-pay | Admitting: Physical Therapy

## 2022-09-13 ENCOUNTER — Ambulatory Visit: Payer: Medicaid Other | Attending: Nurse Practitioner | Admitting: Physical Therapy

## 2022-09-27 ENCOUNTER — Encounter: Payer: Self-pay | Admitting: Physical Therapy

## 2022-09-27 ENCOUNTER — Ambulatory Visit: Payer: Medicaid Other | Admitting: Physical Therapy

## 2022-09-27 ENCOUNTER — Encounter: Payer: Medicaid Other | Admitting: Physical Therapy

## 2023-05-22 ENCOUNTER — Ambulatory Visit: Payer: Medicaid Other | Admitting: Dermatology

## 2023-08-15 DIAGNOSIS — Z3009 Encounter for other general counseling and advice on contraception: Secondary | ICD-10-CM | POA: Diagnosis not present

## 2023-08-15 DIAGNOSIS — Z3046 Encounter for surveillance of implantable subdermal contraceptive: Secondary | ICD-10-CM | POA: Diagnosis not present

## 2023-10-10 DIAGNOSIS — Z113 Encounter for screening for infections with a predominantly sexual mode of transmission: Secondary | ICD-10-CM | POA: Diagnosis not present

## 2023-10-10 DIAGNOSIS — Z3009 Encounter for other general counseling and advice on contraception: Secondary | ICD-10-CM | POA: Diagnosis not present

## 2023-10-10 DIAGNOSIS — Z114 Encounter for screening for human immunodeficiency virus [HIV]: Secondary | ICD-10-CM | POA: Diagnosis not present

## 2023-10-10 DIAGNOSIS — Z3046 Encounter for surveillance of implantable subdermal contraceptive: Secondary | ICD-10-CM | POA: Diagnosis not present
